# Patient Record
Sex: Female | Born: 1940 | Race: White | Hispanic: No | Marital: Married | Smoking: Never smoker
Health system: Southern US, Community
[De-identification: ages and names within clinical notes are randomized; demographics above are authoritative.]

## PROBLEM LIST (undated history)

## (undated) DIAGNOSIS — N6092 Unspecified benign mammary dysplasia of left breast: Secondary | ICD-10-CM

## (undated) HISTORY — DX: Unspecified benign mammary dysplasia of left breast: N60.92

---

## 2002-09-10 ENCOUNTER — Ambulatory Visit (HOSPITAL_COMMUNITY): Admission: RE | Admit: 2002-09-10 | Discharge: 2002-09-10 | Payer: Self-pay | Admitting: Cardiology

## 2003-08-19 ENCOUNTER — Ambulatory Visit (HOSPITAL_COMMUNITY): Admission: RE | Admit: 2003-08-19 | Discharge: 2003-08-19 | Payer: Self-pay | Admitting: General Surgery

## 2003-08-23 ENCOUNTER — Encounter: Payer: Self-pay | Admitting: General Surgery

## 2003-08-23 ENCOUNTER — Ambulatory Visit (HOSPITAL_COMMUNITY): Admission: RE | Admit: 2003-08-23 | Discharge: 2003-08-23 | Payer: Self-pay | Admitting: General Surgery

## 2003-09-09 ENCOUNTER — Encounter (INDEPENDENT_AMBULATORY_CARE_PROVIDER_SITE_OTHER): Payer: Self-pay | Admitting: Specialist

## 2003-09-09 ENCOUNTER — Inpatient Hospital Stay (HOSPITAL_COMMUNITY): Admission: RE | Admit: 2003-09-09 | Discharge: 2003-09-14 | Payer: Self-pay | Admitting: General Surgery

## 2009-03-14 ENCOUNTER — Ambulatory Visit: Payer: Self-pay | Admitting: Hematology & Oncology

## 2009-04-28 ENCOUNTER — Ambulatory Visit: Payer: Self-pay | Admitting: Hematology & Oncology

## 2009-05-01 LAB — COMPREHENSIVE METABOLIC PANEL
ALT: 10 U/L (ref 0–35)
AST: 14 U/L (ref 0–37)
Albumin: 3.7 g/dL (ref 3.5–5.2)
Alkaline Phosphatase: 63 U/L (ref 39–117)
Glucose, Bld: 109 mg/dL — ABNORMAL HIGH (ref 70–99)
Potassium: 4.1 mEq/L (ref 3.5–5.3)
Sodium: 142 mEq/L (ref 135–145)
Total Protein: 6.5 g/dL (ref 6.0–8.3)

## 2009-05-01 LAB — CBC WITH DIFFERENTIAL (CANCER CENTER ONLY)
BASO%: 0.2 % (ref 0.0–2.0)
EOS%: 2 % (ref 0.0–7.0)
LYMPH#: 1.8 10*3/uL (ref 0.9–3.3)
MCHC: 33.9 g/dL (ref 32.0–36.0)
MONO#: 0.4 10*3/uL (ref 0.1–0.9)
Platelets: 214 10*3/uL (ref 145–400)
RDW: 11.6 % (ref 10.5–14.6)
WBC: 5.1 10*3/uL (ref 3.9–10.0)

## 2009-05-15 ENCOUNTER — Inpatient Hospital Stay (HOSPITAL_COMMUNITY): Admission: RE | Admit: 2009-05-15 | Discharge: 2009-05-19 | Payer: Self-pay | Admitting: Orthopedic Surgery

## 2009-08-01 ENCOUNTER — Ambulatory Visit: Payer: Self-pay | Admitting: Hematology & Oncology

## 2009-08-03 LAB — CBC WITH DIFFERENTIAL (CANCER CENTER ONLY)
BASO%: 0.4 % (ref 0.0–2.0)
EOS%: 1.5 % (ref 0.0–7.0)
HGB: 11.6 g/dL (ref 11.6–15.9)
LYMPH#: 1.8 10*3/uL (ref 0.9–3.3)
LYMPH%: 37.7 % (ref 14.0–48.0)
MCH: 29.7 pg (ref 26.0–34.0)
MCHC: 33.6 g/dL (ref 32.0–36.0)
MCV: 89 fL (ref 81–101)
MONO%: 8.3 % (ref 0.0–13.0)
Platelets: 213 10*3/uL (ref 145–400)
RDW: 12.2 % (ref 10.5–14.6)
WBC: 4.7 10*3/uL (ref 3.9–10.0)

## 2009-08-04 LAB — COMPREHENSIVE METABOLIC PANEL
Albumin: 3.9 g/dL (ref 3.5–5.2)
Alkaline Phosphatase: 65 U/L (ref 39–117)
BUN: 15 mg/dL (ref 6–23)
Glucose, Bld: 103 mg/dL — ABNORMAL HIGH (ref 70–99)
Potassium: 4 mEq/L (ref 3.5–5.3)
Total Bilirubin: 0.5 mg/dL (ref 0.3–1.2)

## 2009-11-28 ENCOUNTER — Ambulatory Visit: Payer: Self-pay | Admitting: Hematology & Oncology

## 2009-11-30 LAB — CBC WITH DIFFERENTIAL (CANCER CENTER ONLY)
BASO%: 0.3 % (ref 0.0–2.0)
EOS%: 2.9 % (ref 0.0–7.0)
HCT: 37.2 % (ref 34.8–46.6)
HGB: 12.4 g/dL (ref 11.6–15.9)
LYMPH#: 1.8 10*3/uL (ref 0.9–3.3)
MCV: 90 fL (ref 81–101)
MONO#: 0.4 10*3/uL (ref 0.1–0.9)
MONO%: 7.7 % (ref 0.0–13.0)
NEUT#: 2.5 10*3/uL (ref 1.5–6.5)
NEUT%: 51.1 % (ref 39.6–80.0)
Platelets: 240 10*3/uL (ref 145–400)
RBC: 4.12 10*6/uL (ref 3.70–5.32)
RDW: 12.8 % (ref 10.5–14.6)

## 2009-11-30 LAB — CHCC SATELLITE - SMEAR

## 2009-11-30 LAB — FERRITIN: Ferritin: 189 ng/mL (ref 10–291)

## 2010-02-08 ENCOUNTER — Ambulatory Visit (HOSPITAL_COMMUNITY): Admission: RE | Admit: 2010-02-08 | Discharge: 2010-02-08 | Payer: Self-pay | Admitting: Orthopedic Surgery

## 2010-02-10 ENCOUNTER — Emergency Department (HOSPITAL_COMMUNITY): Admission: EM | Admit: 2010-02-10 | Discharge: 2010-02-10 | Payer: Self-pay | Admitting: Emergency Medicine

## 2010-05-28 ENCOUNTER — Ambulatory Visit: Payer: Self-pay | Admitting: Hematology & Oncology

## 2010-06-12 ENCOUNTER — Encounter: Admission: RE | Admit: 2010-06-12 | Discharge: 2010-06-12 | Payer: Self-pay | Admitting: Hematology & Oncology

## 2010-07-17 ENCOUNTER — Ambulatory Visit: Payer: Self-pay | Admitting: Hematology & Oncology

## 2010-09-19 ENCOUNTER — Inpatient Hospital Stay (HOSPITAL_COMMUNITY): Admission: RE | Admit: 2010-09-19 | Discharge: 2010-09-23 | Payer: Self-pay | Admitting: Orthopedic Surgery

## 2010-10-17 ENCOUNTER — Ambulatory Visit: Payer: Self-pay | Admitting: Hematology & Oncology

## 2011-01-29 LAB — CBC
HCT: 31.9 % — ABNORMAL LOW (ref 36.0–46.0)
HCT: 33.5 % — ABNORMAL LOW (ref 36.0–46.0)
HCT: 34.3 % — ABNORMAL LOW (ref 36.0–46.0)
Hemoglobin: 11.4 g/dL — ABNORMAL LOW (ref 12.0–15.0)
MCH: 31 pg (ref 26.0–34.0)
MCH: 31 pg (ref 26.0–34.0)
MCHC: 33.7 g/dL (ref 30.0–36.0)
MCV: 91.7 fL (ref 78.0–100.0)
Platelets: 190 10*3/uL (ref 150–400)
RBC: 3.48 MIL/uL — ABNORMAL LOW (ref 3.87–5.11)
RBC: 3.73 MIL/uL — ABNORMAL LOW (ref 3.87–5.11)
RDW: 13.5 % (ref 11.5–15.5)
RDW: 13.7 % (ref 11.5–15.5)
WBC: 7.4 10*3/uL (ref 4.0–10.5)

## 2011-01-29 LAB — TYPE AND SCREEN
ABO/RH(D): B POS
Antibody Screen: NEGATIVE

## 2011-01-29 LAB — PROTIME-INR
INR: 1.17 (ref 0.00–1.49)
INR: 1.2 (ref 0.00–1.49)
INR: 1.21 (ref 0.00–1.49)
INR: 1.27 (ref 0.00–1.49)
Prothrombin Time: 15.1 seconds (ref 11.6–15.2)
Prothrombin Time: 15.5 seconds — ABNORMAL HIGH (ref 11.6–15.2)
Prothrombin Time: 16.1 seconds — ABNORMAL HIGH (ref 11.6–15.2)

## 2011-01-29 LAB — BASIC METABOLIC PANEL
CO2: 29 mEq/L (ref 19–32)
Calcium: 9.4 mg/dL (ref 8.4–10.5)
Chloride: 98 mEq/L (ref 96–112)
Creatinine, Ser: 0.78 mg/dL (ref 0.4–1.2)
GFR calc non Af Amer: >60 mL/min (ref >60)
Glucose, Bld: 163 mg/dL — ABNORMAL HIGH (ref 70–99)
Potassium: 3.7 mEq/L (ref 3.5–5.1)

## 2011-01-29 LAB — CARDIAC PANEL(CRET KIN+CKTOT+MB+TROPI)
CK, MB: 0.4 ng/mL (ref 0.3–4.0)
CK, MB: 0.5 ng/mL (ref 0.3–4.0)
CK, MB: 0.5 ng/mL (ref 0.3–4.0)
Relative Index: INVALID (ref 0.0–2.5)
Relative Index: INVALID (ref 0.0–2.5)
Total CK: 34 U/L (ref 7–177)

## 2011-01-30 LAB — URINALYSIS, ROUTINE W REFLEX MICROSCOPIC
Bilirubin Urine: NEGATIVE
Nitrite: NEGATIVE
Protein, ur: NEGATIVE mg/dL
Specific Gravity, Urine: 1.02 (ref 1.005–1.030)
Urobilinogen, UA: 1 mg/dL (ref 0.0–1.0)

## 2011-01-30 LAB — COMPREHENSIVE METABOLIC PANEL
Albumin: 3.7 g/dL (ref 3.5–5.2)
Alkaline Phosphatase: 74 U/L (ref 39–117)
BUN: 14 mg/dL (ref 6–23)
Creatinine, Ser: 0.85 mg/dL (ref 0.4–1.2)
Glucose, Bld: 119 mg/dL — ABNORMAL HIGH (ref 70–99)
Total Protein: 7.2 g/dL (ref 6.0–8.3)

## 2011-01-30 LAB — PROTIME-INR
INR: 1.98 — ABNORMAL HIGH (ref 0.00–1.49)
Prothrombin Time: 22.7 seconds — ABNORMAL HIGH (ref 11.6–15.2)

## 2011-01-30 LAB — CBC
Hemoglobin: 12.9 g/dL (ref 12.0–15.0)
Platelets: 213 10*3/uL (ref 150–400)
RBC: 4.15 MIL/uL (ref 3.87–5.11)
RDW: 13.9 % (ref 11.5–15.5)

## 2011-01-30 LAB — APTT: aPTT: 35 seconds (ref 24–37)

## 2011-01-30 LAB — SURGICAL PCR SCREEN
MRSA, PCR: NEGATIVE
Staphylococcus aureus: NEGATIVE

## 2011-01-30 LAB — URINE MICROSCOPIC-ADD ON

## 2011-02-10 LAB — APTT
aPTT: 21 seconds — ABNORMAL LOW (ref 24–37)
aPTT: 28 seconds (ref 24–37)

## 2011-02-10 LAB — PROTIME-INR
INR: 1.19 (ref 0.00–1.49)
Prothrombin Time: 15 seconds (ref 11.6–15.2)
Prothrombin Time: 19.5 seconds — ABNORMAL HIGH (ref 11.6–15.2)

## 2011-02-10 LAB — URINALYSIS, ROUTINE W REFLEX MICROSCOPIC
Bilirubin Urine: NEGATIVE
Nitrite: NEGATIVE
Specific Gravity, Urine: 1.018 (ref 1.005–1.030)
Urobilinogen, UA: 1 mg/dL (ref 0.0–1.0)
pH: 5.5 (ref 5.0–8.0)

## 2011-02-10 LAB — COMPREHENSIVE METABOLIC PANEL
AST: 19 U/L (ref 0–37)
Alkaline Phosphatase: 63 U/L (ref 39–117)
CO2: 30 mEq/L (ref 19–32)
Calcium: 9.3 mg/dL (ref 8.4–10.5)
Creatinine, Ser: 0.75 mg/dL (ref 0.4–1.2)
GFR calc Af Amer: >60 mL/min (ref >60)
GFR calc non Af Amer: >60 mL/min (ref >60)
Glucose, Bld: 109 mg/dL — ABNORMAL HIGH (ref 70–99)
Potassium: 4.2 mEq/L (ref 3.5–5.1)
Total Protein: 6.9 g/dL (ref 6.0–8.3)

## 2011-02-10 LAB — CBC
Hemoglobin: 14 g/dL (ref 12.0–15.0)
MCHC: 34 g/dL (ref 30.0–36.0)
MCV: 91.5 fL (ref 78.0–100.0)
MCV: 92.6 fL (ref 78.0–100.0)
Platelets: 212 10*3/uL (ref 150–400)
RBC: 3.9 MIL/uL (ref 3.87–5.11)
RBC: 4.49 MIL/uL (ref 3.87–5.11)
RDW: 12.9 % (ref 11.5–15.5)
WBC: 6.8 10*3/uL (ref 4.0–10.5)

## 2011-02-10 LAB — DIFFERENTIAL
Lymphocytes Relative: 29 % (ref 12–46)
Lymphs Abs: 1.9 10*3/uL (ref 0.7–4.0)
Monocytes Relative: 8 % (ref 3–12)
Neutrophils Relative %: 61 % (ref 43–77)

## 2011-02-10 LAB — POCT I-STAT, CHEM 8
Chloride: 107 mEq/L (ref 96–112)
Glucose, Bld: 110 mg/dL — ABNORMAL HIGH (ref 70–99)
HCT: 45 % (ref 36.0–46.0)
Hemoglobin: 15.3 g/dL — ABNORMAL HIGH (ref 12.0–15.0)
Potassium: 3.7 mEq/L (ref 3.5–5.1)
Sodium: 141 mEq/L (ref 135–145)
TCO2: 25 mmol/L (ref 0–100)

## 2011-02-10 LAB — URINE MICROSCOPIC-ADD ON

## 2011-02-10 LAB — SAMPLE TO BLOOD BANK

## 2011-02-24 LAB — CBC
Platelets: 169 10*3/uL (ref 150–400)
WBC: 8 10*3/uL (ref 4.0–10.5)

## 2011-02-24 LAB — PROTIME-INR
INR: 1.7 — ABNORMAL HIGH (ref 0.00–1.49)
Prothrombin Time: 20.3 seconds — ABNORMAL HIGH (ref 11.6–15.2)
Prothrombin Time: 20.9 seconds — ABNORMAL HIGH (ref 11.6–15.2)

## 2011-02-25 LAB — ABO/RH: ABO/RH(D): B POS

## 2011-02-25 LAB — COMPREHENSIVE METABOLIC PANEL
ALT: 13 U/L (ref 0–35)
Albumin: 3.4 g/dL — ABNORMAL LOW (ref 3.5–5.2)
Alkaline Phosphatase: 72 U/L (ref 39–117)
GFR calc Af Amer: >60 mL/min (ref >60)
Potassium: 4.2 mEq/L (ref 3.5–5.1)
Sodium: 140 mEq/L (ref 135–145)
Total Protein: 6.5 g/dL (ref 6.0–8.3)

## 2011-02-25 LAB — BASIC METABOLIC PANEL
BUN: 10 mg/dL (ref 6–23)
CO2: 27 mEq/L (ref 19–32)
CO2: 31 mEq/L (ref 19–32)
Chloride: 98 mEq/L (ref 96–112)
Glucose, Bld: 108 mg/dL — ABNORMAL HIGH (ref 70–99)
Potassium: 3.5 mEq/L (ref 3.5–5.1)
Potassium: 3.6 mEq/L (ref 3.5–5.1)
Sodium: 134 mEq/L — ABNORMAL LOW (ref 135–145)

## 2011-02-25 LAB — URINALYSIS, ROUTINE W REFLEX MICROSCOPIC
Specific Gravity, Urine: 1.019 (ref 1.005–1.030)
Urobilinogen, UA: 0.2 mg/dL (ref 0.0–1.0)

## 2011-02-25 LAB — CBC
HCT: 33.6 % — ABNORMAL LOW (ref 36.0–46.0)
HCT: 34.2 % — ABNORMAL LOW (ref 36.0–46.0)
Hemoglobin: 11.7 g/dL — ABNORMAL LOW (ref 12.0–15.0)
MCHC: 34.3 g/dL (ref 30.0–36.0)
MCHC: 34.3 g/dL (ref 30.0–36.0)
MCV: 91.5 fL (ref 78.0–100.0)
Platelets: 156 10*3/uL (ref 150–400)
Platelets: 186 10*3/uL (ref 150–400)
RBC: 3.67 MIL/uL — ABNORMAL LOW (ref 3.87–5.11)
RDW: 13.4 % (ref 11.5–15.5)
RDW: 13.6 % (ref 11.5–15.5)
WBC: 6.7 10*3/uL (ref 4.0–10.5)

## 2011-02-25 LAB — URINE MICROSCOPIC-ADD ON

## 2011-02-25 LAB — PROTIME-INR
INR: 1.1 (ref 0.00–1.49)
INR: 2 — ABNORMAL HIGH (ref 0.00–1.49)

## 2011-04-02 NOTE — H&P (Signed)
NAME:  Kathryn Steele, Kathryn Steele           ACCOUNT NO.:  [id number]   MEDICAL RECORD NO.:  [id number]          PATIENT TYPE:  INP   LOCATION:  0009                         FACILITY:  [facility]   PHYSICIAN:  [name], M.D.    DATE OF BIRTH:  10-Aug-1941   DATE OF ADMISSION:  05/15/2009  DATE OF DISCHARGE:                              HISTORY & PHYSICAL   DATE OF OFFICE VISIT HISTORY AND PHYSICAL:  May 11, 2009.   CHIEF COMPLAINT:  Right knee pain.   HISTORY OF PRESENT ILLNESS:  The patient is a 70 year old female who has  been seen by Dr. Lequita Halt for ongoing knee pain.  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  The right is more symptomatic  and problematic than the left.  It has been going on for quite some time  now.  It has been refractory to conservative management.  Felt to  benefit from undergoing surgery.  Risks and benefits have been  discussed.  She elected to proceed with surgery.  She has been seen by  Dr. Dulce Sellar preoperatively.  The patient is felt to have an intermittent  risk.  The procedure does have a high-risk clinical variable, but the  patient has elected to proceed with surgery.  It is felt that at the  present time her heart failure is compensated.  It is not felt she is in  a high-risk room so can simply withdrawal the Coumadin which has been  stopped 5 days prior to surgery and then reinstitute Coumadin or  alternate with low molecular weight heparin.  She was also asked to stop  her aspirin 7 days.  She takes a beta blocker which she will take the  day of surgery.   ALLERGIES:  1. MACRODANTIN.  2. SEPTRA.  3. XANAX.  4. PSEUDOEPHEDRINE.  5. INTOLERANCE TO OXYCODONE WHICH CAUSES GI UPSET.   CURRENT MEDICATIONS:  1. Nitroglycerin.  2. Vasotec.  3. Amlodipine.  4. Furosemide.  5. Lopressor.  6. Liquid potassium chloride.  7. Coumadin.  8. Lipitor.  9. Baby aspirin.  10.AcipHex.  11.Clarinex.  12.Hydrocodone 7.5.  13.Albuterol  inhalation aerosol.  14.Evista.  15.Vitamin D.   PAST MEDICAL HISTORY:  1. History of atrial fibrillation.  2. Chronic rhinitis.  3. History of congestive heart failure.  4. Coronary arterial disease, 30% PDA irregularity.  5. Esophageal reflux.  6. Hiatal hernia.  7. Hypertension.  8. Impaired fasting glucose.  9. Left atrial enlargement.  10.Postmenopausal.  11.Mitral annular calcification with regurgitation.  12.Morbid obesity.  13.Osteoarthritis.  14.History of skin cancer.  15.Tricuspid regurgitation.  16.Varicose veins.  17.History of venous stasis.  18.History of shingles.  19.Hyperlipidemia.  20.Childhood illnesses of measles.   PAST SURGICAL HISTORY:  1. Gallbladder surgery in 1995.  2. Mohs surgery left eyelid in 1996.  3. Colon and hernia surgery in 2004.  4. Cardiac catheterization procedure.   FAMILY HISTORY:  Mother deceased at age 79 with heart disease.   SOCIAL HISTORY:  Married, nonsmoker.  No alcohol.  Husband will be  assisting with care after surgery.   REVIEW OF SYSTEMS:  GENERAL:  No fevers, chills or night sweats.  NEUROLOGIC:  No seizures, syncope or paralysis.  RESPIRATORY:  No  shortness of breath, productive cough or hemoptysis.  CARDIOVASCULAR:  No chest pain, angina, or orthopnea.  No palpitations.  GI:  No nausea,  vomiting, diarrhea, constipation.  GU:  No dysuria, hematuria or  discharge.  MUSCULOSKELETAL:  Right knee.   PHYSICAL EXAMINATION:  VITAL SIGNS:  Pulse 56, respirations 14, blood  pressure 132/74.  GENERAL:  A 70 year old female, well-nourished, well-developed,  overweight, obese, in no acute distress.  She is alert, oriented and  cooperative.  HEENT:  Normocephalic, atraumatic.  Pupils are round and reactive.  Oropharynx clear.  She does have a faint carotid bruit on the right.  NECK:  Supple.  CHEST:  Clear.  HEART:  Regular rate and rhythm although a bradycardic rhythm about 56.  S1-S2 noted.  ABDOMEN:  Soft,  protuberant.  Active bowel sounds present.  RECTAL/BREAST/GENITALIA:  Not done; not pertinent to present illness.  EXTREMITIES:  Right knee - no effusion, slight varus malalignment  deformity.  Range of motion 5 to 100 and marked crepitus is noted.   IMPRESSION:  Osteoarthritis right knee.   PLAN:  The patient admitted to [facility] to undergo right  total knee replacement arthroplasty.  Surgery will be performed by [name].      Kathryn Steele, P.A.C.      [name], M.D.  Electronically Signed    ALP/MEDQ  D:  05/14/2009  T:  05/15/2009  Job:  [id number]   cc:   Kathryn Rossetti, MD  Fax: [phone]   Kathryn Steele(?), M.D.  [facility]

## 2011-04-02 NOTE — Op Note (Signed)
NAME:  Kathryn, Steele           ACCOUNT NO.:  [id number]   MEDICAL RECORD NO.:  [id number]          PATIENT TYPE:  INP   LOCATION:  0009                         FACILITY:  [facility]   PHYSICIAN:  [name], M.D.    DATE OF BIRTH:  13-Jan-1941   DATE OF PROCEDURE:  05/15/2009  DATE OF DISCHARGE:                               OPERATIVE REPORT   PREOPERATIVE DIAGNOSIS:  Osteoarthritis, right knee.   POSTOPERATIVE DIAGNOSIS:  Osteoarthritis, right knee.   PROCEDURE:  Right total knee arthroplasty.   SURGEON:  [name], M.D.   ASSISTANT:  [name], P.A.C.   ANESTHESIA:  Spinal.   ESTIMATED BLOOD LOSS:  Minimal.   DRAINS:  None.   TOURNIQUET TIME:  40 minutes at 300 mmHg.   COMPLICATIONS:  None.   CONDITION:  Stable to recovery.   BRIEF CLINICAL NOTE:  Kathryn Steele is a 70 year old female with end-stage  arthritis of the right knee with progressively worsening pain and  dysfunction.  She has failed nonoperative management and presents for  right total knee arthroplasty.   PROCEDURE IN DETAIL:  After successful administration of spinal  anesthetic a tourniquet was placed high on her right thigh and right  lower extremity was prepped and draped in usual sterile fashion.  The  extremity was wrapped in Esmarch, knee flexed, tourniquet inflated to  300 mmHg.  A midline incision was made with a 10 blade through the  subcutaneous tissue to the level of the extensor mechanism.  A fresh  blade was used to make a medial parapatellar arthrotomy.  The soft  tissue on the proximal and medial tibia was subperiosteally elevated to  the joint line with the knife and into the semimembranosus bursa with a  Cobb elevator.  The soft tissue laterally was elevated with attention  being paid to avoid the patellar tendon on tibial tubercle.  The patella  was subluxed laterally, knee flexed 90 degrees, and the ACL and PCL were  removed.  The drill was used create a starting hole  in the distal femur  and the canal was thoroughly irrigated.  The 5 degrees right valgus  alignment guide was placed and referencing off the posterior condyles  rotation was marked and the block pinned to remove 10 mm off the distal  femur.  The distal femoral resection was made with an oscillating saw.  The sizing block was placed and size 3 was the most appropriate.  Rotation was marked at the epicondylar axis.  The size 3 cutting block  was placed and the anterior, posterior and chamfer cuts were made.   The tibia was subluxed forward and menisci removed.  The extramedullary  tibial alignment guide was placed referencing proximally at the medial  aspect of the tibial tubercle and distally along the second metatarsal  axis and tibial crest.  The block was pinned to remove 2 mm off the more  deficient medial side.  Tibial resection was made with an oscillating  saw.  The size 2.5 was the most appropriate tibial component and the  proximal tibia was prepared with the modular drill and keel punch  for  the size 2.5.  Femoral preparation was completed with the intercondylar  cut.   The size 2.5 mobile bearing tibial trial, the size 3 posterior  stabilized femoral trial and the 10 mm posterior stabilized rotating  platform insert trial were placed.  With the 10, full extension ws  achieved with excellent varus, valgus and anterior and posterior balance  throughout full range of motion.  The patella was then everted and  thickness measured to be 23 mm.  The freehand resection was taken to 13  mm, a 35 template was placed, lug holes were drilled, the trial patella  was placed and it tracks normally.  Osteophytes were removed off the  posterior femur with the trial in place.  All trials were removed and  the cut bone surfaces were prepared with pulsatile lavage.  The cement  was mixed and once ready for implantation the size 2.5 mobile bearing  tibial tray, the size 3 posterior stabilized  femur and 35 patella were  cemented into place.  The patella was held with a clamp.  The trial 10  mm insert was placed.  The knee held in full extension and all extruded  cement removed.  When the cement was fully hardened then the permanent  10 mm posterior stabilized rotating platform insert was placed into the  tibial tray.  The wound was copiously irrigated with saline solution and  FloSeal injected on the posterior capsule, the medial and lateral  gutters and suprapatellar area.  A moist sponge was placed and  tourniquet released for a total time of 40 minutes.  The sponge was held  for 2 minutes then removed.  Minimal bleeding was encountered.  The  bleeding that was encountered was stopped with electrocautery.  The  wound was again irrigated and the arthrotomy closed with interrupted #1  PDS.  Flexion against gravity was 130 degrees at which point calf and  posterior thigh are touching.  The subcutaneous was then closed with  interrupted 2-0 Vicryl and subcuticular running 4-0 Monocryl.  The  incision was then cleaned and dried and Steri-Strips and a bulky sterile  dressing were applied.  She was then placed into a knee immobilizer,  awakened and transported to recovery in stable condition.      [name], M.D.  Electronically Signed     FA/MEDQ  D:  05/15/2009  T:  05/15/2009  Job:  [id number]

## 2011-04-05 NOTE — Discharge Summary (Signed)
NAME:  Kathryn Steele, Kathryn Steele                     ACCOUNT NO.:  [id number]   MEDICAL RECORD NO.:  [id number]                   PATIENT TYPE:  INP   LOCATION:  0363                                 FACILITY:  [facility]   PHYSICIAN:  Angelia Mould. Derrell Lolling, M.D.             DATE OF BIRTH:  Apr 13, 1941   DATE OF ADMISSION:  09/09/2003  DATE OF DISCHARGE:  09/14/2003                                 DISCHARGE SUMMARY   FINAL DIAGNOSES:  1. 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  2. Paroxysmal atrial fibrillation history.  3. History of congestive heart failure on Coumadin.  4. Umbilical hernia.  5. Hypertension.  6. Obesity.   OPERATION PERFORMED:  1. Laparoscopic assisted right colectomy.  2. Repair of incarcerated ventral incisional hernia.   DATE OF SURGERY:  September 09, 2003.   HISTORY:  This is a 70 year old white female who was being worked up for  reflux and heartburn.  Upper endoscopy showed gastritis.  A colonoscopy was  performed on August 05, 2003 which showed a 3 cm diameter cecal polyp  which could not be removed because it was sessile.  This was biopsied and  showed villous adenoma with high-grade dysplasia.  Because of her cardiac  disease and morbid obesity she came to [facility] for consideration of  elective surgery.  She was scheduled for a laparoscopic colon resection.  She had preoperative cardiac clearance by Dr. Sylvie Farrier in [place].  She  underwent bowel prep at home.  She stopped her aspirin and Coumadin 5-6 days  preop.   PHYSICAL EXAMINATION:  GENERAL:  Pleasant middle-aged woman.  Weight of 256  pounds, height 5 feet 3 inches.  NECK:  No adenopathy.  RESPIRATORY:  Lungs were clear.  No rales or rhonchi.  CARDIOVASCULAR:  Regular rate and rhythm no murmur.  Peripheral pulses were  palpable.  ABDOMEN:  The abdomen was soft, obese, nontender.  Well-healed trocar sites  from cholecystectomy.  Four centimeter soft tissue mass at the umbilicus  which is only  partially reducible consistent with a partially incarcerated  incisional hernia at her previous trocar site.  LYMPHATIC:  No enlarged lymph node in the neck or groins.   HOSPITAL COURSE:  On the day of admission the patient was taken to the  operating room and underwent a laparoscopic assisted right colectomy with  primary anastomosis and we repaired her incarcerated ventral hernia at the  same time.   Final pathology report showed villous adenoma of the cecum with high-grade  dysplasia.  Ten lymph nodes were evaluated and were negative for tumor.   Postoperatively she was followed by [facility].  She was noted to be  in sinus rhythm, really did not have any problems with recurrent atrial  fibrillation.  She progressed in her activity and diet reasonably well.  She  was able to start tolerating liquids on about the third postop day and  advanced her diet fairly regularly thereafter  without much trouble.  Her  Coumadin was restarted when she resumed a diet and she recovered  uneventfully.   She was discharged on September 14, 2003.  At that time she was afebrile,  tolerating a diet, feeling well, was having bowel movements.  Her incisions  were healing nicely.   She was asked to follow up with me at the office in 5-7 days for staple  removal.  She was asked to follow up with Dr. Gilford Raid Harsh in [place] to  check her prothrombin time and regulate her Coumadin.   She requested a rolling walker and we gave her a prescription for that.   She was given a prescription for Vicodin for pain.  She was told to continue  her usual medications which include nitroglycerin 0.4 mg/hr patch daily,  Vasotec 10 mg b.i.d., Lasix 40 mg daily, Celebrex 200 mg daily, Lopressor 75  mg daily.  Potassium chloride 20 mEq daily.  Aspirin 81 mg daily.  Coumadin  5 mg alternating with 4.5 mg on a daily basis.  Lipitor 10 mg daily.  Provera 5 mg daily.  Aciphex 20 mg daily.                                                Angelia Mould. Derrell Lolling, M.D.    HMI/MEDQ  D:  09/25/2003  T:  09/25/2003  Job:  [id number]   cc:   Lorita Officer, M.D.  [address], [place]   [address]  [address] [place]  [place] [address]  Fax: [phone]   Heide Guile, MD  [address]  [place]  [place] [address]  Fax: [phone]   [facility]

## 2011-04-05 NOTE — Cardiovascular Report (Signed)
   NAME:  Kathryn Steele, Kathryn Steele                     ACCOUNT NO.:  [id number]   MEDICAL RECORD NO.:  [id number]                   PATIENT TYPE:  OIB   LOCATION:  2856                                 FACILITY:  [facility]   PHYSICIAN:  Charlton Haws, MD [facility]                DATE OF BIRTH:  Jan 22, 1941   DATE OF PROCEDURE:  09/10/2002  DATE OF DISCHARGE:                              CARDIAC CATHETERIZATION   PROCEDURES:  Coronary arteriography.   CARDIOLOGIST:  Charlton Haws, MD   INDICATIONS:  Chest pain and paroxysmal atrial fibrillation.   DESCRIPTION OF PROCEDURE:  Catheterization was done from the right femoral  artery.   RESULTS:  Left main coronary artery  was normal.   Left anterior descending artery was normal.   First and second diagonal branches were normal.   Circumflex coronary artery was nondominant and normal.   Right coronary artery was dominant.  Proximal, mid, and distal vessels were  normal.  There was a 30% mid PDA lesion.   LEFT VENTRICULOGRAPHY:  Left ventriculography showed hyperdynamic LV  function. EF was greater than 80%.  There was no gradient across the aortic  valve and no MR.  LV pressure was in the 115/15 range.  Aortic pressure was  in the 120/84 range.   IMPRESSION:  The patient's chest pain would appear to be noncardiac in  etiology.  She is 70 years  old with no obvious structural heart disease,  and I would not think initially she would need Coumadin for PAF.  However,  she will follow up with Dr. Sylvie Farrier for further medical management of her PAF,  hypertension, central obesity.                                               Charlton Haws, MD [facility]    PN/MEDQ  D:  09/10/2002  T:  09/11/2002  Job:  [id number]   cc:   Gilford Raid Harsh  [address]  [place]  [place] [address]  Fax: [phone]

## 2011-04-05 NOTE — Consult Note (Signed)
NAME:  Kathryn Steele, Kathryn Steele                     ACCOUNT NO.:  [id number]   MEDICAL RECORD NO.:  [id number]                   PATIENT TYPE:  INP   LOCATION:  0363                                 FACILITY:  [facility]   PHYSICIAN:  Rollene Rotunda, M.D.                DATE OF BIRTH:  Feb 21, 1941   DATE OF CONSULTATION:  09/09/2003  DATE OF DISCHARGE:                                   CONSULTATION   REQUESTING PHYSICIAN:  Angelia Mould. Derrell Lolling, M.D.   REASON FOR CONSULTATION:  Evaluate patient with heart failure and atrial  fibrillation status post right colectomy.   HISTORY OF PRESENT ILLNESS:  The patient is a lovely 70 year old white  female who had surgery today for resection of a probable villous adenoma.  She is status post right colectomy.  We are consulted to manage arrhythmias  and volume.   The patient's cardiac history include paroxysmal atrial fibrillation.  She  had this in October 2003 and apparently again one time this year.  She was  hospitalized at [facility] in October and had a cardiac catheterization  which demonstrated nonobstructive coronary disease and well-preserved  ejection fraction.  She has also had a Cardiolite which has demonstrated no  ischemia.  She has been maintained on Coumadin.  She has also been treated  with aspirin and topical nitrates per Dr. Sylvie Farrier.  Most recently she reports  she was hospitalized earlier this year in [place] with congestive heart  failure.  She does not recall being told what her ejection fraction was or  whether she had a weak or a strong heart.  I suspect a well-preserved  ejection fraction.   At baseline the patient reports that she does some household chores.  However, she is limited by osteoarthritis.  She gets dyspnea with exertion  such as walking a short distance on level ground.  She denies any PND or  orthopnea.  She has some occasional sporadic chest discomfort.  This seems  to be a stable pattern.  She cannot bring this on  with exertion.  She is not  describing exertional neck discomfort, arm discomfort, activity-induced  nausea, vomiting, excessive diaphoresis.  She occasionally will notice  palpitations but has no presyncope or syncope.   PAST MEDICAL HISTORY:  1. Paroxysmal atrial fibrillation.  2. Hyperlipidemia.  3. Hypertension.  4. Obesity.  5. Heart failure as described above.  6. Nonobstructive coronary disease (30% mid PDA stenosis).  7. Gastritis.  8. Cecal polyps.   PAST SURGICAL HISTORY:  1. D&C.  2. Hysterectomy.  3. Laparoscopic cholecystectomy.   ALLERGIES:  1. MACRODANTIN.  2. SEPTRA.  3. Prudy Feeler.   MEDICATIONS PRIOR TO ADMISSION:  1. Coumadin.  2. Nitro-Derm patch.  3. Vasotec 10 mg b.i.d.  4. Lasix 40 mg daily.  5. Celebrex 200 mg daily.  6. Lopressor 75 mg daily.  7. K-Dur 20 mEq daily.  8. Aspirin 81 mg daily.  9.  Centrum.  10.      Lipitor 10 mg q.h.s.  11.      Provera 5 mg daily.  12.      Aciphex 20 mg daily.   SOCIAL HISTORY:  The patient lives in [place] in with her husband.  She  does not drink alcohol or smoke cigarettes.   FAMILY HISTORY:  Is contributory for her mother dying with congestive heart  failure.  Otherwise, does not appear to be an early heart disease history.   REVIEW OF SYSTEMS:  As stated in the HPI and positive for wheezing and sinus  drainage.  Otherwise, negative for all other systems.   PHYSICAL EXAMINATION:  GENERAL:  The patient is in no distress.  VITAL SIGNS:  Blood pressure 154/90, heart rate 80 and regular, 98%  saturation on 2 liters.  HEENT:  Eyes unremarkable.  Pupils equal, round, and reactive to light.  Fundi not visualized.  Oral mucosa unremarkable.  NECK:  The patient lying flat, no bruits, no obvious thyromegaly.  LYMPHATIC:  No obvious cervical or inguinal adenopathy.  LUNGS:  Clear to auscultation anteriorly.  CHEST:  Unremarkable.  HEART:  PMI not displaced or sustained.  S1 and S2 within normal limits.  No  S3,  no S4, no murmurs.  ABDOMEN:  Surgical wound.  No bowel sounds.  Did not palpate secondary to  surgical pain. Obese.  EXTREMITIES:  Show 2+ pulses throughout.  No femoral bruits.  No edema, no  cyanosis, no clubbing.  NEUROLOGIC:  Sedated but appropriate, moves all extremities.   LABORATORY DATA:  Chest x-ray (October 5):  Mild cardiomegaly with mild  edema.   EKG (May 2004):  Sinus bradycardia, rate 47, nonspecific T wave changes.  No  current EKGs for comparison.   Laboratory:  Wbc 6.1, hemoglobin 12.9, platelets 279.  Sodium 140, potassium  3.6, BUN 22, creatinine 1.0, AST 20, ALT 13.  INR 1.6.   ASSESSMENT AND PLAN:  1. Atrial fibrillation.  The patient has a history of paroxysmal atrial     fibrillation.  Will keep her on telemetry.  We will start her Lopressor     back when she is able to take p.o.'s.  If she has any paroxysms of this     we will treat it most likely with IV Cardizem in the meantime.  She can     have her Coumadin restarted when it is safe from a surgical standpoint.     There would be no need for bridging heparin.  2. Congestive heart failure.  Will try to get an estimate of her ejection     fraction from [place] records.  I suspect she has a well-preserved EF.     Therefore, the attention would be turned toward volume management and     control of blood pressure.  Will collect strict I&O and use Lasix as     needed.  3. Coronary disease.  The patient has nonobstructive coronary disease.  She     can be restarted on her previous medications prior to discharge or when     able to take p.o.'s.                                               Rollene Rotunda, M.D.    JH/MEDQ  D:  09/09/2003  T:  09/09/2003  Job:  [id number]   cc:   Angelia Mould. Derrell Lolling, M.D.  [address], [address]  [place]  [place] [address]  Fax: [phone]   Heide Guile, MD  [address]  [place]  [place] [address]  Fax: [phone]

## 2011-04-05 NOTE — Discharge Summary (Signed)
NAME:  Kathryn Steele, Kathryn Steele           ACCOUNT NO.:  [id number]   MEDICAL RECORD NO.:  [id number]          PATIENT TYPE:  INP   LOCATION:  1602                         FACILITY:  [facility]   PHYSICIAN:  [name], M.D.    DATE OF BIRTH:  July 31, 1941   DATE OF ADMISSION:  05/15/2009  DATE OF DISCHARGE:  05/19/2009                               DISCHARGE SUMMARY   ADMISSION DIAGNOSES:  1. Osteoarthritis right knee.  2. History of atrial fibrillation.  3. Chronic rhinitis.  4. History of congestive heart failure.  5. Coronary arterial disease (30% posterior descending artery).  6. Esophageal reflux.  7. Hiatal hernia.  8. Hypertension.  9. Impaired fasting glucose.  10.Left atrial enlargement.  11.Postmenopausal.  12.Mitral annular calcification with regurgitation.  13.Morbid obesity.  14.Osteoarthritis.  15.History of skin cancer.  16.Tricuspid regurgitation.  17.Varicose veins.  18.History of venostasis.  19.History of shingles.  20.Hyperlipidemia.  21.Childhood illnesses of measles.   DISCHARGE DIAGNOSES:  1. 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  2. Postoperative hyponatremia, improved.  3. History of atrial fibrillation.  4. Chronic rhinitis.  5. History of congestive heart failure.  6. Coronary arterial disease (30% posterior descending artery).  7. Esophageal reflux.  8. Hiatal hernia.  9. Hypertension.  10.Impaired fasting glucose.  11.Left atrial enlargement.  12.Postmenopausal.  13.Mitral annular calcification with regurgitation.  14.Morbid obesity.  15.Osteoarthritis.  16.History of skin cancer.  17.Tricuspid regurgitation.  18.Varicose veins.  19.History of venostasis.  20.History of shingles.  21.Hyperlipidemia.  22.Childhood illnesses of measles.   PROCEDURE:  May 15, 2009, right total knee.   SURGEON:  [name], M.D.   ANESTHESIA:  Spinal.   TOURNIQUET TIME:  40.   CONSULTS:  None.   BRIEF  HISTORY:  Kathryn Steele is a 70 year old female with end-stage  arthritis of the right knee, progressive worsening pain and dysfunction,  failed operative management and now presents for right total knee  arthroplasty.   LABORATORY DATA:  Preoperative CBC showed hemoglobin 12.2, hematocrit  36.6, white cell count 4.8, platelets 186.  Postoperative hemoglobin  11.5, came back up to 11.7.  Last known H and H 10.6 and 31.7.  PT/INR  23.9 and 2.0 with PTT of 33.  Rechecked INR on day of admission 1.1.  Serial pro-times followed per Coumadin protocol.  Last known PT/INR 20.9  and 1.7.  Chem panel on admission low albumin of 3.4.  Remaining chem  panel within normal limits.  Serial BMETs were followed.  Sodium dropped  from 140 to 132, back up to 134.  Preoperative UA small hemoglobin,  small leukocyte esterase, many epithelials, 3-6 white cells, only 0-2  red cells, many bacteria.  Blood group type B+.   DIAGNOSTICS:  Chest x-ray May 10, 2009:  Bilateral central mild  peribronchial thickening, probably due to chronic mild bronchitic  changes, no acute infiltrate or edema.  There is nodular density in the  left base probable nodule or nipple.  Follow up non-emergent study with  nipple markers suggested   HOSPITAL COURSE:  The patient was admitted to [facility],  taken to the OR and underwent the above stated procedure without  complication.  The patient tolerated the procedure well. Later  transferred to the recovery room on the orthopedic floor.  Started on  PCA and p.o. analgesics.  Had a rough night on the evening of surgery.  Seen in rounds by Dr. Lequita Halt.  Did not get much rest.  She actually was  transferred to telemetry for monitoring.  Did okay and then later  transferred to the orthopedic floor on day 1.  She did have Duramorph.  Pressure was stable.  Hemoglobin looked good, although she did have a  little bit of hyponatremia.  She had a history of congestive heart   failure, but had excellent urinary output and pressure was okay, so we  to reduced the fluid rate, started back on her home medications.  Put  some of her blood pressure medications on parameters though so she would  not bottom out.  Actually did pretty well and by day 2, she was up  walking about 35 feet.  Dressing changed on day 2 and incision looked  good.  Doing a little better with her pain.  Hemoglobin was down a  little bit from her preoperative, but asymptomatic with the anemia.  Pressure remained stable.  Continued to get up with therapy walking 100  feet by day 3.  Hemoglobin was stable at that point.  Slowly increased  on her INR.  Needed another day with therapy.  By postoperative day 4,  she was doing better, pain under good control, tolerating her  medications and was discharged home.   DISPOSITION:  Discharged home on May 19, 2009.   DISCHARGE MEDICATIONS:  1. Lovenox before discharge.  2. Then Vicodin.  3. Robaxin.  4. Coumadin.   DIET:  Heart-healthy diet.   FOLLOW UP:  Follow up in 2 weeks.   ACTIVITY:  Total knee protocol.  Home PT home nursing.   DISPOSITION:  Home.   CONDITION ON DISCHARGE:  Improved.      Kathryn Steele, P.A.C.      [name], M.D.  Electronically Signed    ALP/MEDQ  D:  05/31/2009  T:  05/31/2009  Job:  [id number]   cc:   [name], M.D.  Fax: [phone]   Feliciana Rossetti, MD  Fax: [phone]   Judge Stall, MD

## 2011-04-05 NOTE — Op Note (Signed)
NAME:  Kathryn Steele, Kathryn Steele                     ACCOUNT NO.:  [id number]   MEDICAL RECORD NO.:  [id number]                   PATIENT TYPE:  INP   LOCATION:  X001                                 FACILITY:  [facility]   PHYSICIAN:  Angelia Mould. Derrell Lolling, M.D.             DATE OF BIRTH:  [birth year]   DATE OF PROCEDURE:  09/09/2003  DATE OF DISCHARGE:                                 OPERATIVE REPORT   PREOPERATIVE DIAGNOSES:  1. Villous adenoma of the cecum.  2. Ventral hernia.   POSTOPERATIVE DIAGNOSES:  1. Villous adenoma of the cecum.  2. Incarcerated ventral hernia.   </OPERATION PERFORMED>  1. Laparoscopic-assisted right colectomy.  2. Repair of ventral hernia.   SURGEON:  Angelia Mould. Derrell Lolling, M.D.   FIRST ASSISTANT:  Currie Paris, M.D.   OPERATIVE INDICATION:  This is a 70 year old white female with reflux  problems and a history of hypertension and congestive heart failure and  paroxysmal atrial fibrillation.  She has had a recent GI workup, which  showed gastritis on the upper endoscopy and a 3.0 cm polyp in the cecum, and  biopsy showed a villous adenoma with dysplasia.  A CT scan shows no evidence  of any metastatic disease.  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  She was counseled in the office regarding surgical options.  She  underwent a two-day bowel prep at home.  She is brought to the operating  room electively for a right colon resection and repair of her large hernia,  which is around her umbilicus.  This is within a previous laparoscopic scar  from cholecystectomy.   OPERATIVE FINDINGS:  The patient had a ventral hernia around her umbilicus,  which was at least 6 cm in diameter.  There were extensive adhesions of  omentum up into this, which required about 30 minutes to release prior to  proceeding with the other surgery.  She had mild adhesions of the omentum up  to the gallbladder fossa.  Small bowel, colon, liver, stomach, and duodenum  otherwise looked normal.  There was no fluid within the abdomen.  The  peritoneal surfaces looked normal.   OPERATIVE TECHNIQUE:  Following the induction of general endotracheal  anesthesia, a Foley catheter and orogastric tube were placed.  The abdomen  was then prepped and draped in a sterile fashion.  A 10 mm trocar was placed  in the left upper quadrant.  A small incision was made and an Optiview port  was inserted with the video camera within the port, and we were able to  insert this through the abdominal wall layers without much trouble.  Insufflation was then carried out and we got a good pneumoperitoneum.  The  video camera was inserted.  Everything looked fine.  There was no sign of  any bleeding or trauma.  We placed two 5 mm trocars in the left midabdomen  and then using a Harmonic  scalpel and counter traction, we carefully took  the adhesions down around the ventral hernia defect.  This took a great deal  of time as they were fairly extensive, but there was no evidence of any  involvement of the GI tract and there was good hemostasis after we were  done.   We then ultimately put a 5 mm trocar in the right midabdomen and a 10 mm  trocar in the subxiphoid region.  With the patient in a variety of  positions, we mobilized the right colon.  We divided the peritoneum lateral  to the terminal ileum and appendix and then divided the lateral peritoneal  attachments of the entire right colon, bluntly mobilizing the right colon  over to the midline.  This mobilization went fairly smoothly.  We then  repositioned the patient and took the hepatic flexure down using the  Harmonic scalpel, mobilized the hepatic flexure all the way over to the  level of the falciform ligament.  You could pull the hepatic flexure down  nicely and pull the cecum over to the left quite nicely.   We then removed our instruments and released the pneumoperitoneum.  We made  a transverse incision overlying the  umbilicus.  We debrided a very large  complex hernia sac until we could identify the rim of ventral hernia and  then enter the abdominal cavity.  We brought the terminal ileum and right  colon up through this defect.  We placed stay sutures in the terminal ileum  to avoid twisting.  We transected the terminal ileum about one inch proximal  to the ileocecal valve.  We took down the mesentery using electrocautery,  dividing the larger ileocolic vessels and the right colic vessels between  clamps and ligating with 2-0 silk suture ligatures and 2-0 silk ties.  We  transected the right transverse colon using a GIA stapling device.  The  specimen was removed and sent to the lab.  Dr. Kieth Brightly examined the  specimen and stated that there was a 2-3 cm soft polyp in the cecum  consistent with the colonoscopic findings.   Anastomosis was created between the terminal ileum and the right transverse  colon using the GIA stapling device.  The defect left in the bowel wall was  closed using a TA-60 stapling device.  The staple lines looked good.  They  were quite hemostatic.  At this point we changed our instruments and our  gloves.  The mesentery was closed with interrupted sutures of 2-0 silk.  We  irrigated the wound and then returned the bowel to the abdominal cavity.  Hemostasis looked good.  We irrigated out the abdominal wound.  We closed  the abdominal wall fascia transversely with numerous interrupted simple and  several interrupted figure-of-eight sutures of #1 Novofil.  This provided a  very secure closure.  Pneumoperitoneum was created.  We had an airtight  closure of the fascia.  We reinserted our video camera and our instruments  and inspected the remaining colon and small bowel and the anastomosis, and  everything looked good.  We irrigated somewhat, but there was no bleeding.  All the irrigation fluid was removed.  The pneumoperitoneum was released.  All the trocars were removed and  there  was no bleeding from the trocar sites.  A 19 Blake drain was placed in the  periumbilical incision, where the hernia sac and hernia space had been.  This was brought out through one of the trocar sites and  sutured to the skin  with a nylon suture and connected to a suction bulb.  All of the skin  incisions were closed with skin staples.  Clean bandages were placed and the  patient taken to the recovery room in stable condition.  Estimated blood  loss was about 100 mL.  Complications:  None.  Sponge, needle, and  instrument counts were correct.                                               Angelia Mould. Derrell Lolling, M.D.    HMI/MEDQ  D:  09/09/2003  T:  09/09/2003  Job:  [id number]   cc:   [facility]  [address] [place]  [place] [address]  Fax: [phone]   Nadine Counts  [address]  [place] [address]  Fax: [phone]   Heide Guile, MD  [address]  [place]  [place] [address]  Fax: [phone]   [facility]

## 2011-05-23 ENCOUNTER — Other Ambulatory Visit: Payer: Self-pay | Admitting: Hematology & Oncology

## 2011-05-23 ENCOUNTER — Encounter (HOSPITAL_BASED_OUTPATIENT_CLINIC_OR_DEPARTMENT_OTHER): Payer: Medicare Other | Admitting: Hematology & Oncology

## 2011-05-23 DIAGNOSIS — N6089 Other benign mammary dysplasias of unspecified breast: Secondary | ICD-10-CM

## 2011-05-23 DIAGNOSIS — D649 Anemia, unspecified: Secondary | ICD-10-CM

## 2011-05-23 DIAGNOSIS — E559 Vitamin D deficiency, unspecified: Secondary | ICD-10-CM

## 2011-05-23 LAB — CBC WITH DIFFERENTIAL (CANCER CENTER ONLY)
BASO#: 0 10*3/uL (ref 0.0–0.2)
Eosinophils Absolute: 0.2 10*3/uL (ref 0.0–0.5)
HCT: 36 % (ref 34.8–46.6)
HGB: 11.8 g/dL (ref 11.6–15.9)
LYMPH%: 34.9 % (ref 14.0–48.0)
MCH: 30.1 pg (ref 26.0–34.0)
MCV: 92 fL (ref 81–101)
MONO%: 12.2 % (ref 0.0–13.0)
RBC: 3.92 10*6/uL (ref 3.70–5.32)

## 2011-12-04 ENCOUNTER — Ambulatory Visit (HOSPITAL_BASED_OUTPATIENT_CLINIC_OR_DEPARTMENT_OTHER): Payer: Medicare Other | Admitting: Hematology & Oncology

## 2011-12-04 ENCOUNTER — Other Ambulatory Visit (HOSPITAL_BASED_OUTPATIENT_CLINIC_OR_DEPARTMENT_OTHER): Payer: Medicare Other | Admitting: Lab

## 2011-12-04 DIAGNOSIS — N6089 Other benign mammary dysplasias of unspecified breast: Secondary | ICD-10-CM

## 2011-12-04 DIAGNOSIS — E559 Vitamin D deficiency, unspecified: Secondary | ICD-10-CM

## 2011-12-04 DIAGNOSIS — D649 Anemia, unspecified: Secondary | ICD-10-CM

## 2011-12-04 DIAGNOSIS — N6099 Unspecified benign mammary dysplasia of unspecified breast: Secondary | ICD-10-CM

## 2011-12-04 DIAGNOSIS — I482 Chronic atrial fibrillation, unspecified: Secondary | ICD-10-CM

## 2011-12-04 DIAGNOSIS — R5383 Other fatigue: Secondary | ICD-10-CM

## 2011-12-04 DIAGNOSIS — R5381 Other malaise: Secondary | ICD-10-CM

## 2011-12-04 LAB — CBC WITH DIFFERENTIAL (CANCER CENTER ONLY)
BASO#: 0 10*3/uL (ref 0.0–0.2)
BASO%: 0.6 % (ref 0.0–2.0)
EOS%: 2.9 % (ref 0.0–7.0)
HCT: 35.1 % (ref 34.8–46.6)
HGB: 11.3 g/dL — ABNORMAL LOW (ref 11.6–15.9)
LYMPH#: 1.5 10*3/uL (ref 0.9–3.3)
MONO#: 0.6 10*3/uL (ref 0.1–0.9)
NEUT#: 2.6 10*3/uL (ref 1.5–6.5)
NEUT%: 54.4 % (ref 39.6–80.0)
RDW: 13.2 % (ref 11.1–15.7)
WBC: 4.8 10*3/uL (ref 3.9–10.0)

## 2011-12-04 NOTE — Progress Notes (Signed)
This office note has been dictated.

## 2011-12-04 NOTE — Progress Notes (Signed)
DIAGNOSIS:  Atypical ductal hyperplasia of the left breast.  CURRENT THERAPY:  Aromasin 25 mg p.o. daily.  INTERIM HISTORY:  [name] comes in for followup.  She is doing quite well.  She is getting through her knee surgery.  She is now off therapy for this.  Her husband is doing better.  He is being treated at [facility] for mantle cell lymphoma.  It seems like he has had a very nice response.  He is taking Rituxan right now.  [name] still has her issues with atrial fibrillation.  She gets quite fatigued.  She has dyspnea with mild exertion.  She is now on Xarelto.  She really enjoys being on this as she does not need to have her blood checked all the time, and she can eat what she wants.  She is having no problems with the Aromasin.  There have been no hot flashes or sweats.  PHYSICAL EXAM:  General:  This is a mildly obese white female in no obvious distress.  Vital Signs:  Temperature of 98, pulse 90, respiratory rate 18, blood pressure 115/69.  Weight is 230.  Head/Neck: Exam shows a normocephalic, atraumatic skull.  There are no ocular or oral lesions.  There are no palpable cervical or supraclavicular lymph nodes.  Lungs:  Clear bilaterally.  Cardiac:  Regular rate and irregular rhythm consistent with atrial fibrillation.  She has no murmurs, rubs or bruits.  Breasts:  Exam shows right breast with no masses, edema or erythema.  There is no right axillary adenopathy. The left breast shows a well-healed biopsy scar at the 1 o'clock position.  There may be some slight contraction of the left breast.  No tenderness is noted to palpation of the left breast.  There is no left axillary adenopathy. Abdomen:  Soft with good bowel sounds.  She is somewhat obese.  She has no fluid wave.  There is no palpable hepatosplenomegaly.  Extremities: Some stasis dermatitis changes in the lower extremities.  Skin:  No rashes, ecchymosis or petechiae.  LABORATORY STUDIES:  White cell  count 4.8, hemoglobin 11.3, hematocrit 35.1, platelet count 196.  IMPRESSION:  Kathryn Steele is a 71 year old white female with atypical ductal hyperplasia of the left breast.  She is doing well with Aromasin. Again, Aromasin is more of a preventive measure so that she does not develop anything more invasive.  I am glad that her husband is doing better.  He had a really tough time with treatment.  We will have Ms. Reising come back to see [place] in another 6 months.    ______________________________ Josph Macho, M.D. PRE/MEDQ  D:  12/04/2011  T:  12/04/2011  Job:  [id number]

## 2011-12-05 LAB — VITAMIN D 25 HYDROXY (VIT D DEFICIENCY, FRACTURES): Vit D, 25-Hydroxy: 43 ng/mL (ref 30–89)

## 2011-12-05 LAB — COMPREHENSIVE METABOLIC PANEL
ALT: 10 U/L (ref 0–35)
AST: 17 U/L (ref 0–37)
Albumin: 4 g/dL (ref 3.5–5.2)
CO2: 27 mEq/L (ref 19–32)
Calcium: 9.6 mg/dL (ref 8.4–10.5)
Chloride: 102 mEq/L (ref 96–112)
Creatinine, Ser: 0.91 mg/dL (ref 0.50–1.10)
Potassium: 3.9 mEq/L (ref 3.5–5.3)
Total Protein: 6.5 g/dL (ref 6.0–8.3)

## 2011-12-12 ENCOUNTER — Other Ambulatory Visit: Payer: Self-pay | Admitting: Hematology & Oncology

## 2011-12-30 ENCOUNTER — Telehealth: Payer: Self-pay | Admitting: *Deleted

## 2011-12-30 NOTE — Telephone Encounter (Signed)
Message copied by Mirian Capuchin on Mon Dec 30, 2011  9:18 AM ------      Message from: Arlan Organ R      Created: Thu Dec 26, 2011  6:53 PM       Labs and vit D are ok!!  Kathryn Steele

## 2011-12-30 NOTE — Telephone Encounter (Signed)
Called patient to let her know that her labs and vitamin d levels were ok per dr. Myna Hidalgo .

## 2012-06-01 ENCOUNTER — Ambulatory Visit (HOSPITAL_BASED_OUTPATIENT_CLINIC_OR_DEPARTMENT_OTHER): Payer: Medicare Other | Admitting: Hematology & Oncology

## 2012-06-01 ENCOUNTER — Other Ambulatory Visit: Payer: Medicare Other | Admitting: Lab

## 2012-06-01 VITALS — BP 123/71 | HR 72 | Temp 97.0°F | Ht 64.0 in | Wt 232.0 lb

## 2012-06-01 DIAGNOSIS — N6089 Other benign mammary dysplasias of unspecified breast: Secondary | ICD-10-CM

## 2012-06-01 DIAGNOSIS — D0590 Unspecified type of carcinoma in situ of unspecified breast: Secondary | ICD-10-CM

## 2012-06-01 DIAGNOSIS — M818 Other osteoporosis without current pathological fracture: Secondary | ICD-10-CM

## 2012-06-01 MED ORDER — VITAMIN D (ERGOCALCIFEROL) 1.25 MG (50000 UNIT) PO CAPS: 50000.0000 [IU] | ORAL_CAPSULE | ORAL | Status: DC

## 2012-06-01 MED ORDER — EXEMESTANE 25 MG PO TABS: 25.0000 mg | ORAL_TABLET | Freq: Every day | ORAL | Status: DC

## 2012-06-01 NOTE — Progress Notes (Signed)
This office note has been dictated.

## 2012-06-02 NOTE — Progress Notes (Signed)
CC:   Kathryn Pia, MD  DIAGNOSIS:  Atypical ductal hyperplasia, left breast.  CURRENT THERAPY:  Aromasin 25 mg p.o. daily.  INTERIM HISTORY:  Kathryn Steele comes in for followup.  We see her every 6 months.  She is still doing well with Xarelto.  She has atrial fibrillation that is fairly well-controlled.  There have been no problems with bleeding.  She does get tired with mild exertion.  Patient has had no fevers.  There have been no rashes.  There has been no increased leg swelling.  She does wear compression stockings on her legs.  She has had no cough.  There has been no dysphasia or odynophagia.  There has been no double vision or blurred vision.  PHYSICAL EXAMINATION:  This is an obese, well-nourished white female in no obvious distress.  Vital signs:  Temperature of 97, pulse 73, respiratory rate 18, blood pressure 123/71.  Weight is 232. Head and neck:  Normocephalic, atraumatic skull.  There are no ocular or oral lesions.  There are no palpable cervical or supraclavicular lymph nodes. Lungs:  Clear bilaterally.  Cardiac:  Regular rate and rhythm consistent with atrial fibrillation.  Her rate is well controlled.  Abdomen:  Soft with good bowel sounds.  There is no palpable abdominal mass.  There is no fluid wave.  There is no palpable hepatosplenomegaly.  Back:  No tenderness over the spine, ribs, or hips.  Extremities:  No clubbing, cyanosis or edema.  Neurologic:  No focal neurological deficit.  LABORATORY STUDIES:  Not done this visit.  IMPRESSION:  Kathryn Steele is a nice 71 year old white female with a history of atypical ductal hyperplasia.  She is on Aromasin as a preventive agent.  I believe that she is doing very well with Aromasin. I believe Aromasin is helping her out.  We will go ahead and plan to get her back in 6 more months.  Will check some lab work on her when we see her back.    ______________________________ Josph Macho, M.D. PRE/MEDQ  D:   06/01/2012  T:  06/02/2012  Job:  [id number]

## 2012-12-02 ENCOUNTER — Ambulatory Visit (HOSPITAL_BASED_OUTPATIENT_CLINIC_OR_DEPARTMENT_OTHER): Payer: Medicare Other | Admitting: Hematology & Oncology

## 2012-12-02 ENCOUNTER — Other Ambulatory Visit (HOSPITAL_BASED_OUTPATIENT_CLINIC_OR_DEPARTMENT_OTHER): Payer: Medicare Other | Admitting: Lab

## 2012-12-02 VITALS — BP 125/54 | HR 74 | Temp 98.1°F | Resp 16 | Ht 65.0 in | Wt 233.0 lb

## 2012-12-02 DIAGNOSIS — D0510 Intraductal carcinoma in situ of unspecified breast: Secondary | ICD-10-CM

## 2012-12-02 DIAGNOSIS — D649 Anemia, unspecified: Secondary | ICD-10-CM

## 2012-12-02 DIAGNOSIS — D0590 Unspecified type of carcinoma in situ of unspecified breast: Secondary | ICD-10-CM

## 2012-12-02 DIAGNOSIS — M810 Age-related osteoporosis without current pathological fracture: Secondary | ICD-10-CM

## 2012-12-02 LAB — CBC WITH DIFFERENTIAL (CANCER CENTER ONLY)
BASO#: 0 10*3/uL (ref 0.0–0.2)
EOS%: 3.7 % (ref 0.0–7.0)
Eosinophils Absolute: 0.2 10*3/uL (ref 0.0–0.5)
HCT: 36.4 % (ref 34.8–46.6)
HGB: 11.8 g/dL (ref 11.6–15.9)
LYMPH%: 31.8 % (ref 14.0–48.0)
MCH: 30.2 pg (ref 26.0–34.0)
MCHC: 32.4 g/dL (ref 32.0–36.0)
MCV: 93 fL (ref 81–101)
MONO%: 11.6 % (ref 0.0–13.0)
NEUT#: 2.4 10*3/uL (ref 1.5–6.5)
NEUT%: 52.3 % (ref 39.6–80.0)
RBC: 3.91 10*6/uL (ref 3.70–5.32)

## 2012-12-02 NOTE — Progress Notes (Signed)
This office note has been dictated.

## 2012-12-03 LAB — COMPREHENSIVE METABOLIC PANEL
Albumin: 4.2 g/dL (ref 3.5–5.2)
BUN: 19 mg/dL (ref 6–23)
Calcium: 9.5 mg/dL (ref 8.4–10.5)
Chloride: 103 mEq/L (ref 96–112)
Creatinine, Ser: 0.75 mg/dL (ref 0.50–1.10)
Glucose, Bld: 100 mg/dL — ABNORMAL HIGH (ref 70–99)
Potassium: 3.7 mEq/L (ref 3.5–5.3)

## 2012-12-03 LAB — IRON AND TIBC
Iron: 72 ug/dL (ref 42–145)
TIBC: 395 ug/dL (ref 250–470)

## 2012-12-03 LAB — VITAMIN D 25 HYDROXY (VIT D DEFICIENCY, FRACTURES): Vit D, 25-Hydroxy: 41 ng/mL (ref 30–89)

## 2012-12-03 NOTE — Progress Notes (Signed)
CC:   Kathryn Pia, MD  DIAGNOSES: 1. Atypical ductal hyperplasia of the left breast. 2. Chronic atrial fibrillation.  CURRENT THERAPY: 1. Aromasin 25 mg p.o. daily. 2. Xarelto 20 mg p.o. daily.  INTERIM HISTORY:  Kathryn Steele comes in for her followup.  We see her pretty much once a year now.  I saw her last in July.  She has been doing okay.  She is having some knee issues.  She did have bilateral knee surgery a couple of years ago.  She has had no problems with the Xarelto.  There has been no bleeding. She has had no cough.  There has been no headache.  She has some stasis dermatitis in her legs.  She wears compression stockings.  PHYSICAL EXAMINATION:  General:  This is a mildly obese white female in no obvious distress.  Vital signs:  Temperature of 98.1, pulse 74, respiratory rate 16, blood pressure 125/54.  Weight is 233.  Head and neck:  Normocephalic, atraumatic skull.  There are no ocular or oral lesions.  There are no palpable cervical or supraclavicular lymph nodes. Lungs:  Clear bilaterally.  There are no rales, wheezes, or rhonchi. Cardiac:  Regular rate and rhythm consistent with atrial fibrillation. Her rate is well controlled.  She has a 1/6 systolic ejection murmur. Breasts:  Right breast with no masses, edema, or erythema.  There is no right axillary adenopathy.  Left breast shows well-healed lumpectomy at the, I think, 4 o'clock position.  This is at the edge of the areola. No distinct mass is noted in the left breast.  There is no left axillary adenopathy.  Abdomen:  Soft with good bowel sounds.  There is no palpable abdominal mass.  There is no fluid wave.  There is no palpable hepatosplenomegaly.  Back:  No tenderness over the spine, ribs, or hips. There is no kyphosis or osteoporotic changes.  Extremities:  Compression stockings are on her lower legs.  Skin:  No rashes, ecchymosis, or petechia.  LABORATORY STUDIES:  White cell count is 4.7, hemoglobin  11.8, hematocrit 36.4, platelet count 217.  Ferritin is 69 with an iron saturation of 18%.  IMPRESSION:  Kathryn Steele is a 72 year old white female with atypical ductal hyperplasia of the left breast.  She had this removed.  I have her on Aromasin given that she is a high-risk patient.  I think we can probably get her back yearly now.  I think that it has now been probably 4 or 5 years that she had the surgery for the hyperplasia.  I do not think we need to get her back in between for any lab work or x- rays.    ______________________________ Josph Macho, M.D. PRE/MEDQ  D:  12/02/2012  T:  12/03/2012  Job:  [id number]

## 2012-12-04 ENCOUNTER — Telehealth: Payer: Self-pay | Admitting: Oncology

## 2012-12-04 NOTE — Telephone Encounter (Addendum)
Message copied by Lacie Draft on Fri Dec 04, 2012  1:45 PM ------      Message from: Arlan Organ R      Created: Thu Dec 03, 2012  6:53 PM       Call - labs are ok!!!  Cindee Lame  12/04/2012 Gave pt message. Teola Bradley, Marlyn Rabine [employer]

## 2013-08-13 ENCOUNTER — Other Ambulatory Visit: Payer: Self-pay | Admitting: *Deleted

## 2013-08-13 DIAGNOSIS — D0590 Unspecified type of carcinoma in situ of unspecified breast: Secondary | ICD-10-CM

## 2013-08-13 MED ORDER — VITAMIN D (ERGOCALCIFEROL) 1.25 MG (50000 UNIT) PO CAPS: 50000.0000 [IU] | ORAL_CAPSULE | ORAL | Status: DC

## 2013-08-13 NOTE — Telephone Encounter (Signed)
Received a refill request from [facility] for Vitamin D 50,000 iu q week. This is a chronic med for the pt. Refilled via e-rx.

## 2013-09-28 ENCOUNTER — Other Ambulatory Visit: Payer: Self-pay | Admitting: *Deleted

## 2013-09-28 DIAGNOSIS — N6099 Unspecified benign mammary dysplasia of unspecified breast: Secondary | ICD-10-CM

## 2013-09-28 MED ORDER — EXEMESTANE 25 MG PO TABS: 25.0000 mg | ORAL_TABLET | ORAL | Status: DC

## 2013-09-28 NOTE — Telephone Encounter (Signed)
Received a refill request from [facility] for Exemestane 25 mg daily. This is a chronic med for the pt. Refilled via e-rx.

## 2013-11-16 DIAGNOSIS — I4821 Permanent atrial fibrillation: Secondary | ICD-10-CM | POA: Insufficient documentation

## 2013-11-23 DIAGNOSIS — R7303 Prediabetes: Secondary | ICD-10-CM | POA: Insufficient documentation

## 2013-11-23 DIAGNOSIS — E119 Type 2 diabetes mellitus without complications: Secondary | ICD-10-CM | POA: Insufficient documentation

## 2013-12-01 ENCOUNTER — Other Ambulatory Visit (HOSPITAL_BASED_OUTPATIENT_CLINIC_OR_DEPARTMENT_OTHER): Payer: Medicare Other | Admitting: Lab

## 2013-12-01 ENCOUNTER — Encounter: Payer: Self-pay | Admitting: Hematology & Oncology

## 2013-12-01 ENCOUNTER — Ambulatory Visit (HOSPITAL_BASED_OUTPATIENT_CLINIC_OR_DEPARTMENT_OTHER): Payer: Medicare Other | Admitting: Hematology & Oncology

## 2013-12-01 VITALS — BP 131/52 | HR 57 | Temp 98.2°F | Resp 14 | Ht 63.0 in | Wt 240.0 lb

## 2013-12-01 DIAGNOSIS — D0590 Unspecified type of carcinoma in situ of unspecified breast: Secondary | ICD-10-CM

## 2013-12-01 DIAGNOSIS — D0510 Intraductal carcinoma in situ of unspecified breast: Secondary | ICD-10-CM

## 2013-12-01 DIAGNOSIS — N6092 Unspecified benign mammary dysplasia of left breast: Secondary | ICD-10-CM

## 2013-12-01 DIAGNOSIS — D649 Anemia, unspecified: Secondary | ICD-10-CM

## 2013-12-01 DIAGNOSIS — M810 Age-related osteoporosis without current pathological fracture: Secondary | ICD-10-CM

## 2013-12-01 HISTORY — DX: Unspecified benign mammary dysplasia of left breast: N60.92

## 2013-12-01 LAB — CBC WITH DIFFERENTIAL (CANCER CENTER ONLY)
BASO#: 0 10*3/uL (ref 0.0–0.2)
BASO%: 0.6 % (ref 0.0–2.0)
EOS ABS: 0.1 10*3/uL (ref 0.0–0.5)
EOS%: 2.3 % (ref 0.0–7.0)
HCT: 37 % (ref 34.8–46.6)
HGB: 11.6 g/dL (ref 11.6–15.9)
LYMPH#: 1.3 10*3/uL (ref 0.9–3.3)
LYMPH%: 25.7 % (ref 14.0–48.0)
MCH: 29.5 pg (ref 26.0–34.0)
MCHC: 31.4 g/dL — ABNORMAL LOW (ref 32.0–36.0)
MCV: 94 fL (ref 81–101)
MONO#: 0.7 10*3/uL (ref 0.1–0.9)
MONO%: 13 % (ref 0.0–13.0)
NEUT%: 58.4 % (ref 39.6–80.0)
NEUTROS ABS: 3 10*3/uL (ref 1.5–6.5)
PLATELETS: 201 10*3/uL (ref 145–400)
RBC: 3.93 10*6/uL (ref 3.70–5.32)
RDW: 13.3 % (ref 11.1–15.7)
WBC: 5.1 10*3/uL (ref 3.9–10.0)

## 2013-12-01 NOTE — Progress Notes (Signed)
This office note has been dictated.

## 2013-12-02 LAB — COMPREHENSIVE METABOLIC PANEL
ALBUMIN: 3.8 g/dL (ref 3.5–5.2)
ALK PHOS: 104 U/L (ref 39–117)
ALT: 11 U/L (ref 0–35)
AST: 17 U/L (ref 0–37)
BILIRUBIN TOTAL: 0.6 mg/dL (ref 0.3–1.2)
BUN: 18 mg/dL (ref 6–23)
CO2: 28 meq/L (ref 19–32)
Calcium: 9.3 mg/dL (ref 8.4–10.5)
Chloride: 103 mEq/L (ref 96–112)
Creatinine, Ser: 0.83 mg/dL (ref 0.50–1.10)
GLUCOSE: 96 mg/dL (ref 70–99)
POTASSIUM: 3.5 meq/L (ref 3.5–5.3)
SODIUM: 143 meq/L (ref 135–145)
TOTAL PROTEIN: 6.8 g/dL (ref 6.0–8.3)

## 2013-12-02 LAB — VITAMIN D 25 HYDROXY (VIT D DEFICIENCY, FRACTURES): Vit D, 25-Hydroxy: 40 ng/mL (ref 30–89)

## 2013-12-02 NOTE — Progress Notes (Signed)
CC:   Gilford Rile, MD  DIAGNOSES: 1. Atypical ductal hyperplasia of the left breast. 2. Chronic atrial fibrillation.  CURRENT THERAPY: 1. Aromasin 25 mg p.o. daily. 2. Xarelto 20 mg p.o. daily.  INTERIM HISTORY:  [name] comes in for her followup.  We see her yearly.  She has had no problems since I last saw her.  She has had knee problems.  She got her knees fixed.  She is walking around a little bit better right now.  She did have a mammogram done yesterday.  This was done at [facility] in [facility].  Mammogram did not show any evidence of suspicious microcalcifications.  She has had no problem with bleeding.  She has had some weight gain. She does drink a lot of soda.  She has had no increased cough or shortness of breath.  She continues on quite a few medications.  PHYSICAL EXAMINATION:  General:  This is an obese white female in no obvious distress.  Vital Signs:  Temperature 98.2, pulse 57, respiratory rate 18, blood pressure 131/52, weight is 240 pounds.  Head and Neck: Normocephalic, atraumatic skull.  There are no ocular or oral lesions. There are no palpable cervical or supraclavicular lymph nodes.  Lungs: Clear bilaterally.  She has no rales, wheezes, or rhonchi.  Cardiac: Irregular rate and irregular rhythm consistent with atrial fibrillation. She has 1/6 systolic ejection murmur.  Breast exam shows right breast with no masses, edema, or erythema.  There is no right axillary adenopathy.  Left breast shows well-healed lumpectomy at the 4 o'clock position.  There may be some slight firmness at the lumpectomy site.  No distinct masses noted in the left breast.  There is no left axillary adenopathy.  Abdomen:  Soft.  She is moderately obese.  She has no fluid wave.  There is no palpable hepatosplenomegaly.  Back exam:  No tenderness over the spine, ribs, or hips.  There is no kyphosis. Extremities:  Show compression stockings on both legs.  She  has surgical scars on the knees.  She has some osteoarthritic changes in her joints. Skin:  No rashes, ecchymosis, or petechia.  LABORATORY STUDIES:  White cell count 5.1, hemoglobin 11.6, hematocrit 37, platelet count 201.  IMPRESSION:  [name] is a very charming 73 year old white female with history of atypical ductal hyperplasia of the left breast.  She is probably __________ 6 years.  Again, I do not see any problems from standpoint of malignancy.  I think she will need to continue on the Aromasin because she is at high risk.  We will go ahead and plan to get her back to see [place] in another year.    ______________________________ Kathryn Steele, M.D. PRE/MEDQ  D:  12/01/2013  T:  12/02/2013  Job:  [id number]

## 2013-12-03 ENCOUNTER — Telehealth: Payer: Self-pay | Admitting: Nurse Practitioner

## 2013-12-03 NOTE — Telephone Encounter (Addendum)
Message copied by Jimmy Footman on Fri Dec 03, 2013  4:19 PM ------      Message from: Burney Gauze R      Created: Thu Dec 02, 2013  4:24 PM       Call - labs are great!! Have a great year!!!  Laurey Arrow ------Pt verbalized understanding and appreciation.

## 2014-11-23 DIAGNOSIS — R0602 Shortness of breath: Secondary | ICD-10-CM | POA: Insufficient documentation

## 2014-11-28 ENCOUNTER — Telehealth: Payer: Self-pay | Admitting: Hematology & Oncology

## 2014-11-28 NOTE — Telephone Encounter (Signed)
Pt moved 1-13 to 2-17 due to husband having appointment same day.

## 2014-11-30 ENCOUNTER — Other Ambulatory Visit: Payer: Medicare Other | Admitting: Lab

## 2014-11-30 ENCOUNTER — Ambulatory Visit: Payer: Medicare Other | Admitting: Hematology & Oncology

## 2015-01-03 ENCOUNTER — Other Ambulatory Visit: Payer: Self-pay | Admitting: *Deleted

## 2015-01-03 DIAGNOSIS — N6092 Unspecified benign mammary dysplasia of left breast: Secondary | ICD-10-CM

## 2015-01-04 ENCOUNTER — Ambulatory Visit (HOSPITAL_BASED_OUTPATIENT_CLINIC_OR_DEPARTMENT_OTHER): Payer: Medicare Other | Admitting: Hematology & Oncology

## 2015-01-04 ENCOUNTER — Encounter: Payer: Self-pay | Admitting: Hematology & Oncology

## 2015-01-04 ENCOUNTER — Other Ambulatory Visit (HOSPITAL_BASED_OUTPATIENT_CLINIC_OR_DEPARTMENT_OTHER): Payer: Medicare Other | Admitting: Lab

## 2015-01-04 VITALS — BP 141/81 | HR 75 | Temp 98.1°F | Resp 16 | Ht 63.0 in | Wt 236.0 lb

## 2015-01-04 DIAGNOSIS — N62 Hypertrophy of breast: Secondary | ICD-10-CM

## 2015-01-04 DIAGNOSIS — D0590 Unspecified type of carcinoma in situ of unspecified breast: Secondary | ICD-10-CM

## 2015-01-04 DIAGNOSIS — I4891 Unspecified atrial fibrillation: Secondary | ICD-10-CM

## 2015-01-04 DIAGNOSIS — N6099 Unspecified benign mammary dysplasia of unspecified breast: Secondary | ICD-10-CM

## 2015-01-04 DIAGNOSIS — N6092 Unspecified benign mammary dysplasia of left breast: Secondary | ICD-10-CM

## 2015-01-04 LAB — CBC WITH DIFFERENTIAL (CANCER CENTER ONLY)
BASO#: 0 10*3/uL (ref 0.0–0.2)
BASO%: 0.4 % (ref 0.0–2.0)
EOS ABS: 0.1 10*3/uL (ref 0.0–0.5)
EOS%: 2.4 % (ref 0.0–7.0)
HCT: 37.2 % (ref 34.8–46.6)
HGB: 11.8 g/dL (ref 11.6–15.9)
LYMPH#: 1.6 10*3/uL (ref 0.9–3.3)
LYMPH%: 34.4 % (ref 14.0–48.0)
MCH: 30.5 pg (ref 26.0–34.0)
MCHC: 31.7 g/dL — ABNORMAL LOW (ref 32.0–36.0)
MCV: 96 fL (ref 81–101)
MONO#: 0.6 10*3/uL (ref 0.1–0.9)
MONO%: 12.7 % (ref 0.0–13.0)
NEUT%: 50.1 % (ref 39.6–80.0)
NEUTROS ABS: 2.3 10*3/uL (ref 1.5–6.5)
PLATELETS: 213 10*3/uL (ref 145–400)
RBC: 3.87 10*6/uL (ref 3.70–5.32)
RDW: 13.2 % (ref 11.1–15.7)
WBC: 4.5 10*3/uL (ref 3.9–10.0)

## 2015-01-04 LAB — COMPREHENSIVE METABOLIC PANEL
ALT: 10 U/L (ref 0–35)
AST: 17 U/L (ref 0–37)
Albumin: 4 g/dL (ref 3.5–5.2)
Alkaline Phosphatase: 91 U/L (ref 39–117)
BUN: 13 mg/dL (ref 6–23)
CALCIUM: 9.8 mg/dL (ref 8.4–10.5)
CHLORIDE: 102 meq/L (ref 96–112)
CO2: 28 mEq/L (ref 19–32)
Creatinine, Ser: 0.9 mg/dL (ref 0.50–1.10)
Glucose, Bld: 94 mg/dL (ref 70–99)
POTASSIUM: 3.8 meq/L (ref 3.5–5.3)
Sodium: 142 mEq/L (ref 135–145)
Total Bilirubin: 1 mg/dL (ref 0.2–1.2)
Total Protein: 6.8 g/dL (ref 6.0–8.3)

## 2015-01-04 MED ORDER — EXEMESTANE 25 MG PO TABS: 25.0000 mg | ORAL_TABLET | ORAL | Status: DC

## 2015-01-04 MED ORDER — VITAMIN D (ERGOCALCIFEROL) 1.25 MG (50000 UNIT) PO CAPS: 50000.0000 [IU] | ORAL_CAPSULE | ORAL | Status: DC

## 2015-01-04 NOTE — Progress Notes (Signed)
Hematology and Oncology Follow Up Visit  Kathryn Steele [id number] 11/29/1940 74 y.o. 01/04/2015   Principle Diagnosis:   Atypical ductal hyperplasia of the left breast  Chronic atrial fibrillation  Current Therapy:    Aromasin 25 mg by mouth daily  Xarelto 20 mg by mouth daily     Interim History:  [name] is back for follow-up. We see her yearly. She has been doing pretty well since we last saw her. Unfortunately, her husband has recurrence of his mantle cell lymphoma. He now is on oral therapy for this.  Kathryn Steele has been getting around okay. She's had bilateral knee surgery area and I think his been 2 or 3 years since she had her knees operated on.  She's had no problems with nausea or vomiting. She's had no leg swelling. She does wear compression stockings. She does have the atrial ablation. This has not caused any issues with heart failure.  She's had no cough. There's been no dyspnea. She's had no change in bowel or bladder habits.  She's not noted any rashes. There's been no bleeding.  Medications:  Current outpatient prescriptions:  .  ACIPHEX 20 MG tablet, Take 20 mg by mouth daily. Does not take as directed, Disp: , Rfl:  .  ALBUTEROL IN, Inhale into the lungs every morning., Disp: , Rfl:  .  amLODipine (NORVASC) 5 MG tablet, Take 5 mg by mouth daily. , Disp: , Rfl:  .  aspirin EC 81 MG tablet, Take 81 mg by mouth daily., Disp: , Rfl:  .  enalapril (VASOTEC) 10 MG tablet, 2 (two) times daily. , Disp: , Rfl:  .  exemestane (AROMASIN) 25 MG tablet, Take 1 tablet (25 mg total) by mouth every morning., Disp: 30 tablet, Rfl: 12 .  furosemide (LASIX) 40 MG tablet, Take 40 mg by mouth 2 (two) times daily. , Disp: , Rfl:  .  HYDROcodone-acetaminophen (LORTAB) 7.5-500 MG per tablet, Take 1 tablet by mouth every 6 (six) hours as needed., Disp: , Rfl:  .  LIPITOR 20 MG tablet, Take 20 mg by mouth daily. , Disp: , Rfl:  .  metoprolol (LOPRESSOR) 50 MG tablet, 2  (two) times daily. Pt takes she takes 1/2 bid, Disp: , Rfl:  .  nitroGLYCERIN (NITRODUR - DOSED IN MG/24 HR) 0.4 mg/hr, Place 1 patch onto the skin daily. , Disp: , Rfl:  .  Potassium Bicarb-Citric Acid 10 MEQ TBEF, Take by mouth 2 (two) times daily. 3 teaspoons bid, Disp: , Rfl:  .  Vitamin D, Ergocalciferol, (DRISDOL) 50000 UNITS CAPS capsule, Take 1 capsule (50,000 Units total) by mouth every 7 (seven) days., Disp: 12 capsule, Rfl: 3 .  XARELTO 20 MG TABS, Take 20 mg by mouth daily. , Disp: , Rfl:   Allergies:  Allergies  Allergen Reactions  . Xanax [Alprazolam]     palpitations  . Other     Pt states she is allergic to some drug but can't remember- she will call us with info.  . Iodinated Diagnostic Agents     Skin burns  . Sulfa Antibiotics     Pain in ears    Past Medical History, Surgical history, Social history, and Family History were reviewed and updated.  Review of Systems: As above  Physical Exam:  height is 5\' 3"  (1.6 m) and weight is 236 lb (107.049 kg). Her oral temperature is 98.1 F (36.7 C). Her blood pressure is 141/81 and her pulse is 75. Her respiration is  66.   Obese white female in no obvious distress. Head and neck exam shows no ocular or oral lesions. She has no palpable cervical or supraclavicular lymph nodes. Lungs are clear cardiac exam irregular rate and rhythm consistent with atrial fibrillation. There are no murmurs, rubs or bruits. Abdomen is soft. She is obese. She has good bowel sounds. There is no fluid wave. There is no palpable liver or spleen tip. Breast exam shows right breast with no masses, edema or erythema. There is no right axillary adenopathy. Left breast shows well-healed lumpectomy at the 4:00 position. There is some slight firmness of the lumpectomy site. No distinct masses noted in the left breast. There is no left axillary adenopathy. Back exam shows no tenderness over the spine, ribs or hips. Extremities shows no clubbing, cyanosis or  edema. She has compression stockings on her legs. She has surgical scars on both knees. She has good range of motion of her joints. Skin exam shows no rashes, ecchymoses or petechia. Neurological exam shows no focal neurological deficit.  Lab Results  Component Value Date   WBC 4.5 01/04/2015   HGB 11.8 01/04/2015   HCT 37.2 01/04/2015   MCV 96 01/04/2015   PLT 213 01/04/2015     Chemistry      Component Value Date/Time   NA 143 12/01/2013 0851   K 3.5 12/01/2013 0851   CL 103 12/01/2013 0851   CO2 28 12/01/2013 0851   BUN 18 12/01/2013 0851   CREATININE 0.83 12/01/2013 0851      Component Value Date/Time   CALCIUM 9.3 12/01/2013 0851   ALKPHOS 104 12/01/2013 0851   AST 17 12/01/2013 0851   ALT 11 12/01/2013 0851   BILITOT 0.6 12/01/2013 0851         Impression and Plan: [name] is 74 year old white female. She has atypical ductal hyperplasia left breast. We have been following this now for over 6 years. There's been no evidence of progression to malignancy.  We will continue her on the Aromasin. She's done very well with this.  I will plan to see her back in one year.  I told her that I would be would have her do see her husband if these become too complicated for her and him over at [place], MD 2/17/20166:15 PM

## 2015-02-15 DIAGNOSIS — J4540 Moderate persistent asthma, uncomplicated: Secondary | ICD-10-CM | POA: Insufficient documentation

## 2015-12-25 ENCOUNTER — Other Ambulatory Visit: Payer: Self-pay | Admitting: Hematology & Oncology

## 2015-12-29 ENCOUNTER — Telehealth: Payer: Self-pay | Admitting: Hematology & Oncology

## 2015-12-29 NOTE — Telephone Encounter (Signed)
Patient called wanting to move this appt to February 14, 2016.       [facility].

## 2016-01-03 ENCOUNTER — Ambulatory Visit: Payer: Medicare Other | Admitting: Hematology & Oncology

## 2016-01-03 ENCOUNTER — Other Ambulatory Visit: Payer: Medicare Other

## 2016-02-13 ENCOUNTER — Other Ambulatory Visit: Payer: Self-pay | Admitting: *Deleted

## 2016-02-13 DIAGNOSIS — N6092 Unspecified benign mammary dysplasia of left breast: Secondary | ICD-10-CM

## 2016-02-13 DIAGNOSIS — E559 Vitamin D deficiency, unspecified: Secondary | ICD-10-CM

## 2016-02-14 ENCOUNTER — Ambulatory Visit (HOSPITAL_BASED_OUTPATIENT_CLINIC_OR_DEPARTMENT_OTHER): Payer: Medicare Other | Admitting: Hematology & Oncology

## 2016-02-14 ENCOUNTER — Other Ambulatory Visit: Payer: Self-pay | Admitting: Family

## 2016-02-14 ENCOUNTER — Other Ambulatory Visit (HOSPITAL_BASED_OUTPATIENT_CLINIC_OR_DEPARTMENT_OTHER): Payer: Medicare Other

## 2016-02-14 ENCOUNTER — Encounter: Payer: Self-pay | Admitting: Hematology & Oncology

## 2016-02-14 VITALS — BP 113/58 | HR 69 | Temp 98.1°F | Resp 16 | Ht 63.0 in | Wt 240.0 lb

## 2016-02-14 DIAGNOSIS — N644 Mastodynia: Secondary | ICD-10-CM | POA: Diagnosis not present

## 2016-02-14 DIAGNOSIS — I4891 Unspecified atrial fibrillation: Secondary | ICD-10-CM

## 2016-02-14 DIAGNOSIS — E559 Vitamin D deficiency, unspecified: Secondary | ICD-10-CM

## 2016-02-14 DIAGNOSIS — N6092 Unspecified benign mammary dysplasia of left breast: Secondary | ICD-10-CM

## 2016-02-14 DIAGNOSIS — N62 Hypertrophy of breast: Secondary | ICD-10-CM

## 2016-02-14 LAB — CBC WITH DIFFERENTIAL (CANCER CENTER ONLY)
BASO#: 0 10*3/uL (ref 0.0–0.2)
BASO%: 0.4 % (ref 0.0–2.0)
EOS ABS: 0.1 10*3/uL (ref 0.0–0.5)
EOS%: 1.6 % (ref 0.0–7.0)
HCT: 38.6 % (ref 34.8–46.6)
HEMOGLOBIN: 12.3 g/dL (ref 11.6–15.9)
LYMPH#: 1.4 10*3/uL (ref 0.9–3.3)
LYMPH%: 26.8 % (ref 14.0–48.0)
MCH: 30.9 pg (ref 26.0–34.0)
MCHC: 31.9 g/dL — ABNORMAL LOW (ref 32.0–36.0)
MCV: 97 fL (ref 81–101)
MONO#: 0.6 10*3/uL (ref 0.1–0.9)
MONO%: 12.4 % (ref 0.0–13.0)
NEUT#: 3 10*3/uL (ref 1.5–6.5)
NEUT%: 58.8 % (ref 39.6–80.0)
PLATELETS: 182 10*3/uL (ref 145–400)
RBC: 3.98 10*6/uL (ref 3.70–5.32)
RDW: 13.1 % (ref 11.1–15.7)
WBC: 5.1 10*3/uL (ref 3.9–10.0)

## 2016-02-14 LAB — COMPREHENSIVE METABOLIC PANEL
ALBUMIN: 3.6 g/dL (ref 3.5–5.0)
ALT: 12 U/L (ref 0–55)
ANION GAP: 11 meq/L (ref 3–11)
AST: 17 U/L (ref 5–34)
Alkaline Phosphatase: 93 U/L (ref 40–150)
BILIRUBIN TOTAL: 0.92 mg/dL (ref 0.20–1.20)
BUN: 16.9 mg/dL (ref 7.0–26.0)
CALCIUM: 10.1 mg/dL (ref 8.4–10.4)
CO2: 29 mEq/L (ref 22–29)
Chloride: 104 mEq/L (ref 98–109)
Creatinine: 1 mg/dL (ref 0.6–1.1)
EGFR: 57 mL/min/{1.73_m2} — AB (ref >90)
Glucose: 88 mg/dl (ref 70–140)
Potassium: 3.7 mEq/L (ref 3.5–5.1)
Sodium: 143 mEq/L (ref 136–145)
TOTAL PROTEIN: 7.2 g/dL (ref 6.4–8.3)

## 2016-02-15 ENCOUNTER — Other Ambulatory Visit: Payer: Self-pay | Admitting: Family

## 2016-02-15 DIAGNOSIS — N6092 Unspecified benign mammary dysplasia of left breast: Secondary | ICD-10-CM

## 2016-02-15 LAB — VITAMIN D 25 HYDROXY (VIT D DEFICIENCY, FRACTURES): Vitamin D, 25-Hydroxy: 46.4 ng/mL (ref 30.0–100.0)

## 2016-02-15 NOTE — Progress Notes (Signed)
Hematology and Oncology Follow Up Visit  TEAGUE POSTEN [id number] 15-Nov-1941 75 y.o. 02/15/2016   Principle Diagnosis:   Atypical ductal hyperplasia of the left breast  Chronic atrial fibrillation  Current Therapy:    Aromasin 25 mg by mouth daily  Xarelto 20 mg by mouth daily     Interim History:  Ms.  Raitz is back for follow-up. We see her yearly. She has been doing pretty well since we last saw her. Unfortunately, her husband has recurrence of his mantle cell lymphoma. He now is on oral therapy for this.  Ms. Oconnell has been getting around okay. She's had bilateral knee surgery . I think his been 3 years since she had her knees operated on.  She's had no problems with nausea or vomiting. She's had no leg swelling. She does wear compression stockings. She does have the atrial fibrillation. This has not caused any issues with heart failure.  She's had no cough. There's been no dyspnea. She's had no change in bowel or bladder habits.  She's not noted any rashes. There's been no bleeding.  Medications:  Current outpatient prescriptions:  .  ACIPHEX 20 MG tablet, Take 20 mg by mouth daily. Does not take as directed, Disp: , Rfl:  .  albuterol (PROAIR HFA) 108 (90 Base) MCG/ACT inhaler, Inhale into the lungs., Disp: , Rfl:  .  ALBUTEROL IN, Inhale into the lungs every morning., Disp: , Rfl:  .  amLODipine (NORVASC) 5 MG tablet, Take 5 mg by mouth daily. , Disp: , Rfl:  .  aspirin EC 81 MG tablet, Take 81 mg by mouth daily., Disp: , Rfl:  .  BREO ELLIPTA 100-25 MCG/INH AEPB, , Disp: , Rfl:  .  enalapril (VASOTEC) 10 MG tablet, 2 (two) times daily. , Disp: , Rfl:  .  exemestane (AROMASIN) 25 MG tablet, Take 1 tablet (25 mg total) by mouth every morning., Disp: 30 tablet, Rfl: 12 .  flecainide (TAMBOCOR) 50 MG tablet, , Disp: , Rfl:  .  furosemide (LASIX) 40 MG tablet, Take 40 mg by mouth 2 (two) times daily. , Disp: , Rfl:  .  HYDROcodone-acetaminophen (LORTAB) 7.5-500  MG per tablet, Take 1 tablet by mouth every 6 (six) hours as needed., Disp: , Rfl:  .  LIPITOR 20 MG tablet, Take 20 mg by mouth daily. , Disp: , Rfl:  .  metoprolol (LOPRESSOR) 50 MG tablet, 2 (two) times daily. Pt takes she takes 1/2 bid, Disp: , Rfl:  .  nitroGLYCERIN (NITRODUR - DOSED IN MG/24 HR) 0.4 mg/hr, Place 1 patch onto the skin daily. , Disp: , Rfl:  .  Potassium Bicarb-Citric Acid 10 MEQ TBEF, Take by mouth 2 (two) times daily. 3 teaspoons bid, Disp: , Rfl:  .  potassium chloride SA (K-DUR,KLOR-CON) 20 MEQ tablet, , Disp: , Rfl:  .  Vitamin D, Ergocalciferol, (DRISDOL) 50000 units CAPS capsule, TAKE 1 CAPSULE BY MOUTH EVERY 7 DAYS, Disp: 12 capsule, Rfl: 3 .  XARELTO 20 MG TABS, Take 20 mg by mouth daily. , Disp: , Rfl:   Allergies:  Allergies  Allergen Reactions  . Xanax [Alprazolam]     palpitations  . Other     Pt states she is allergic to some drug but can't remember- she will call us with info.  . Iodinated Diagnostic Agents     Skin burns  . Sulfa Antibiotics     Pain in ears    Past Medical History, Surgical history, Social history, and Family  History were reviewed and updated.  Review of Systems: As above  Physical Exam:  height is 5\' 3"  (1.6 m) and weight is 240 lb (108.863 kg). Her oral temperature is 98.1 F (36.7 C). Her blood pressure is 113/58 and her pulse is 69. Her respiration is 16.   Obese white female in no obvious distress. Head and neck exam shows no ocular or oral lesions. She has no palpable cervical or supraclavicular lymph nodes. Lungs are clear cardiac exam irregular rate and rhythm consistent with atrial fibrillation. There are no murmurs, rubs or bruits. Abdomen is soft. She is obese. She has good bowel sounds. There is no fluid wave. There is no palpable liver or spleen tip. Breast exam shows right breast with no masses, edema or erythema. There is no right axillary adenopathy. Left breast shows well-healed lumpectomy at the 4:00 position.  There is some slight firmness of the lumpectomy site. No distinct masses noted in the left breast. There is no left axillary adenopathy. Back exam shows no tenderness over the spine, ribs or hips. Extremities shows no clubbing, cyanosis or edema. She has compression stockings on her legs. She has surgical scars on both knees. She has good range of motion of her joints. Skin exam shows no rashes, ecchymoses or petechia. Neurological exam shows no focal neurological deficit.  Lab Results  Component Value Date   WBC 5.1 02/14/2016   HGB 12.3 02/14/2016   HCT 38.6 02/14/2016   MCV 97 02/14/2016   PLT 182 02/14/2016     Chemistry      Component Value Date/Time   NA 143 02/14/2016 0910   NA 142 01/04/2015 0824   K 3.7 02/14/2016 0910   K 3.8 01/04/2015 0824   CL 102 01/04/2015 0824   CO2 29 02/14/2016 0910   CO2 28 01/04/2015 0824   BUN 16.9 02/14/2016 0910   BUN 13 01/04/2015 0824   CREATININE 1.0 02/14/2016 0910   CREATININE 0.90 01/04/2015 0824      Component Value Date/Time   CALCIUM 10.1 02/14/2016 0910   CALCIUM 9.8 01/04/2015 0824   ALKPHOS 93 02/14/2016 0910   ALKPHOS 91 01/04/2015 0824   AST 17 02/14/2016 0910   AST 17 01/04/2015 0824   ALT 12 02/14/2016 0910   ALT 10 01/04/2015 0824   BILITOT 0.92 02/14/2016 0910   BILITOT 1.0 01/04/2015 0824         Impression and Plan: Ms. Thornes is 75 year old white female. She has atypical ductal hyperplasia left breast. We have been following this now for over 6 years. There's been no evidence of progression to malignancy.  We will continue her on the Aromasin. She's done very well with this.  I will plan to see her back in one year.  I told her that I would be would have her do see her husband if these become too complicated for her and him over at [place], MD 3/30/20176:37 PM

## 2016-02-28 DIAGNOSIS — R600 Localized edema: Secondary | ICD-10-CM | POA: Insufficient documentation

## 2016-02-28 DIAGNOSIS — I2510 Atherosclerotic heart disease of native coronary artery without angina pectoris: Secondary | ICD-10-CM | POA: Insufficient documentation

## 2016-02-28 DIAGNOSIS — C189 Malignant neoplasm of colon, unspecified: Secondary | ICD-10-CM | POA: Insufficient documentation

## 2016-02-28 DIAGNOSIS — Z85038 Personal history of other malignant neoplasm of large intestine: Secondary | ICD-10-CM | POA: Insufficient documentation

## 2016-02-28 DIAGNOSIS — K219 Gastro-esophageal reflux disease without esophagitis: Secondary | ICD-10-CM | POA: Insufficient documentation

## 2016-02-28 DIAGNOSIS — E66812 Obesity, class 2: Secondary | ICD-10-CM | POA: Insufficient documentation

## 2016-02-28 DIAGNOSIS — Z79899 Other long term (current) drug therapy: Secondary | ICD-10-CM | POA: Insufficient documentation

## 2016-02-28 DIAGNOSIS — N183 Chronic kidney disease, stage 3 unspecified: Secondary | ICD-10-CM | POA: Insufficient documentation

## 2016-02-28 DIAGNOSIS — E559 Vitamin D deficiency, unspecified: Secondary | ICD-10-CM | POA: Insufficient documentation

## 2016-02-28 DIAGNOSIS — E782 Mixed hyperlipidemia: Secondary | ICD-10-CM | POA: Insufficient documentation

## 2016-02-28 DIAGNOSIS — Z7901 Long term (current) use of anticoagulants: Secondary | ICD-10-CM | POA: Insufficient documentation

## 2016-02-28 DIAGNOSIS — M159 Polyosteoarthritis, unspecified: Secondary | ICD-10-CM | POA: Insufficient documentation

## 2016-03-05 ENCOUNTER — Ambulatory Visit
Admission: RE | Admit: 2016-03-05 | Discharge: 2016-03-05 | Disposition: A | Discharge status: Home / Self Care | Payer: Medicare Other | Source: Ambulatory Visit | Attending: Family | Admitting: Family

## 2016-03-05 DIAGNOSIS — N6092 Unspecified benign mammary dysplasia of left breast: Secondary | ICD-10-CM

## 2016-03-05 MED ORDER — GADOBENATE DIMEGLUMINE 529 MG/ML IV SOLN
20.0000 mL | Freq: Once | INTRAVENOUS | Status: AC | PRN
  Administered 2016-03-05: 20 mL via INTRAVENOUS

## 2016-03-12 ENCOUNTER — Other Ambulatory Visit: Payer: Self-pay | Admitting: Family

## 2016-03-12 DIAGNOSIS — N6092 Unspecified benign mammary dysplasia of left breast: Secondary | ICD-10-CM

## 2016-03-19 ENCOUNTER — Other Ambulatory Visit: Payer: Self-pay | Admitting: Family

## 2016-03-19 ENCOUNTER — Ambulatory Visit
Admission: RE | Admit: 2016-03-19 | Discharge: 2016-03-19 | Disposition: A | Discharge status: Home / Self Care | Payer: Medicare Other | Source: Ambulatory Visit | Attending: Family | Admitting: Family

## 2016-03-19 DIAGNOSIS — N6092 Unspecified benign mammary dysplasia of left breast: Secondary | ICD-10-CM

## 2016-04-09 ENCOUNTER — Ambulatory Visit
Admission: RE | Admit: 2016-04-09 | Discharge: 2016-04-09 | Disposition: A | Discharge status: Home / Self Care | Payer: Medicare Other | Source: Ambulatory Visit | Attending: Family | Admitting: Family

## 2016-04-09 ENCOUNTER — Other Ambulatory Visit: Payer: Self-pay | Admitting: Family

## 2016-04-09 DIAGNOSIS — N6092 Unspecified benign mammary dysplasia of left breast: Secondary | ICD-10-CM

## 2016-04-11 ENCOUNTER — Other Ambulatory Visit: Payer: Self-pay | Admitting: Family

## 2016-04-11 DIAGNOSIS — N6092 Unspecified benign mammary dysplasia of left breast: Secondary | ICD-10-CM

## 2016-04-12 ENCOUNTER — Other Ambulatory Visit: Payer: Self-pay | Admitting: Hematology & Oncology

## 2016-05-16 DIAGNOSIS — M7752 Other enthesopathy of left foot: Secondary | ICD-10-CM | POA: Insufficient documentation

## 2016-05-16 DIAGNOSIS — Q828 Other specified congenital malformations of skin: Secondary | ICD-10-CM | POA: Insufficient documentation

## 2016-05-16 DIAGNOSIS — I872 Venous insufficiency (chronic) (peripheral): Secondary | ICD-10-CM | POA: Insufficient documentation

## 2016-05-22 IMAGING — MR MR BREAST BILAT WO/W CM
7 of 12 series · 25 of 48 positions shown · IV contrast (Multihance 20 ML)
Comparison: Recent screening mammogram dated 01/24/2016.

CLINICAL DATA: History of biopsy-proven ADH within the left breast
being treated with Aromasin and followed with annual mammography.
Per a clinic note, this has been followed for 6 years without
evidence of progression.

Patient also describes diffuse intermittent left breast pain for 6
months.
LABS:  GFR 57, BUN 16.9, creatinine
EXAM:
BILATERAL BREAST MRI WITH AND WITHOUT CONTRAST
TECHNIQUE: Multiplanar, multisequence MR images of both breasts were obtained
prior to and following the intravenous administration of 20 ml of
MultiHance.

[Series 2: STIR · axial · 3.0mm · 0.94mm/px · 1 of 62 slices shown]
[im 1/62]
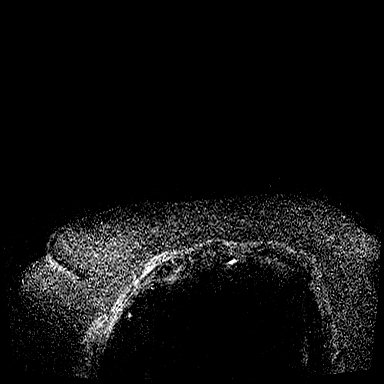

[Series 3: fl3d pre no · axial · non-contrast · 1.0mm · 0.80mm/px · z∈[-37,+138]mm · 4 of 176 slices shown]
[im 1/176]
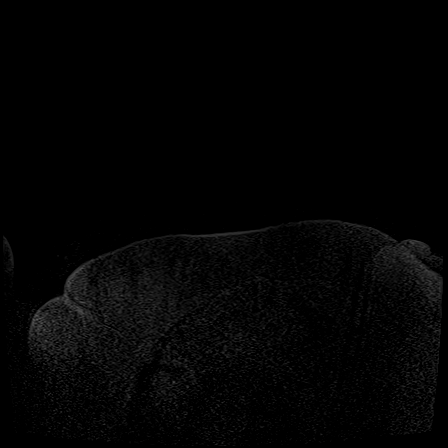
[im 59/176]
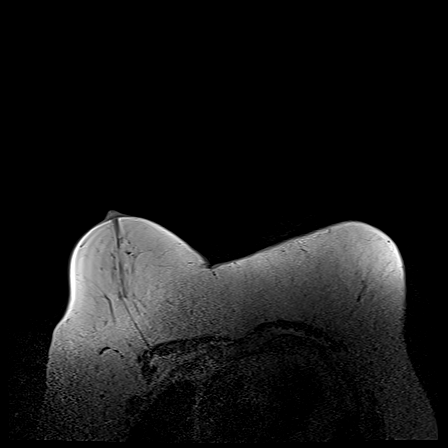
[im 117/176]
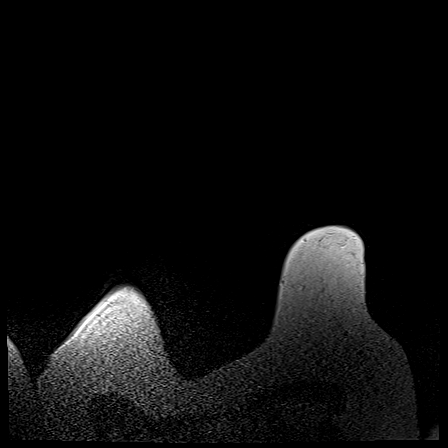
[im 176/176]
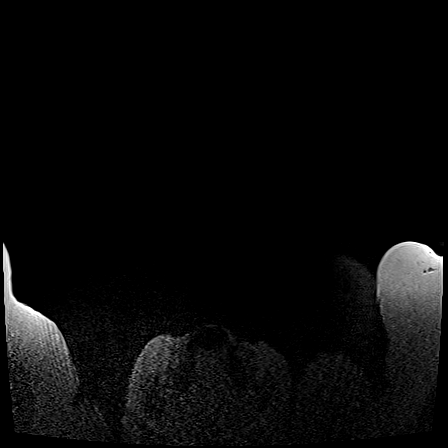

[Series 4: axial pre fs · axial · non-contrast · 1.0mm · 0.80mm/px · z∈[-53,+154]mm · 5 of 208 slices shown]
[im 1/208]
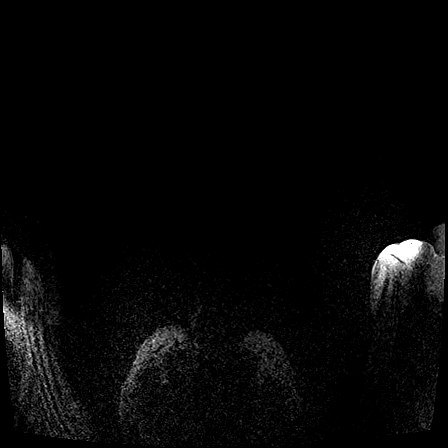
[im 52/208]
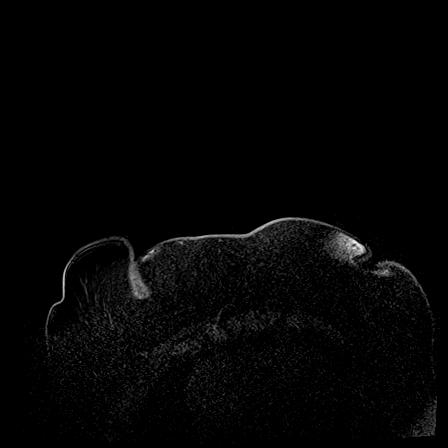
[im 104/208]
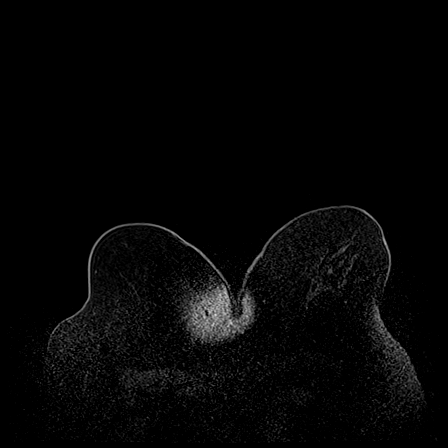
[im 156/208]
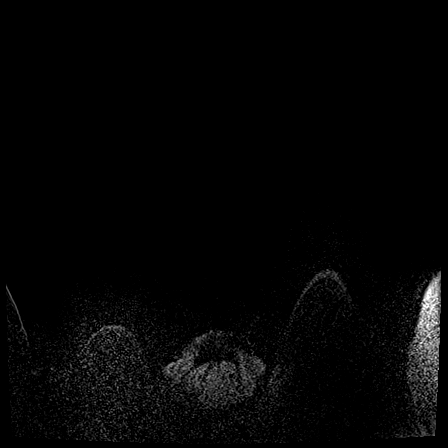
[im 208/208]
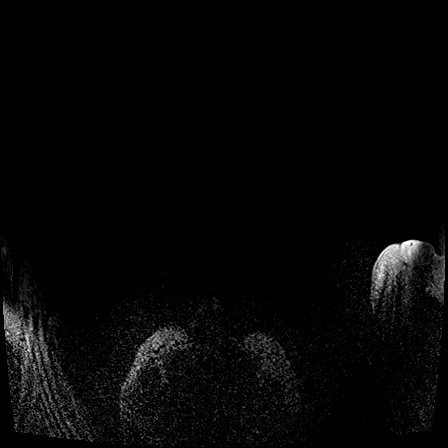

[Series 5: axial post 20 · axial · 1.0mm · 0.80mm/px · z∈[-53,+154]mm · 5 of 208 slices shown (1 of 3)]
[im 1/208]
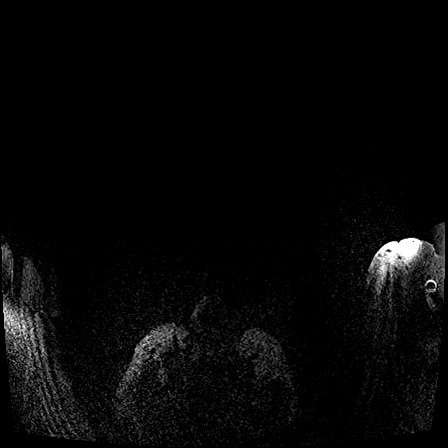
[im 52/208]
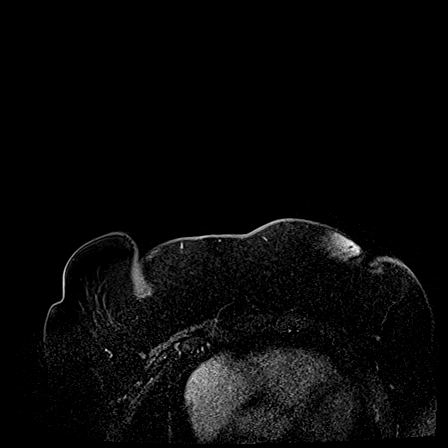
[im 104/208]
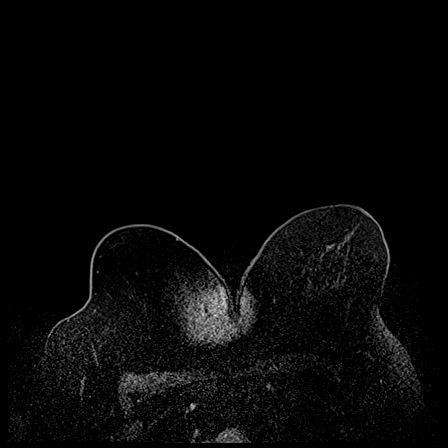
[im 156/208]
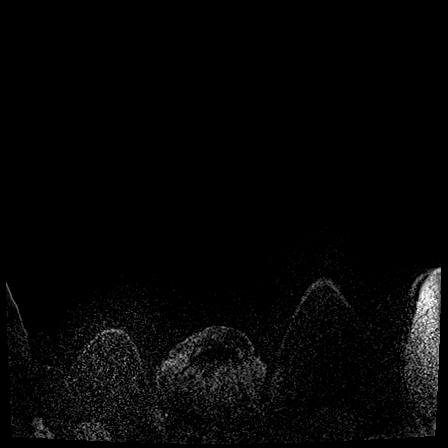
[im 208/208]
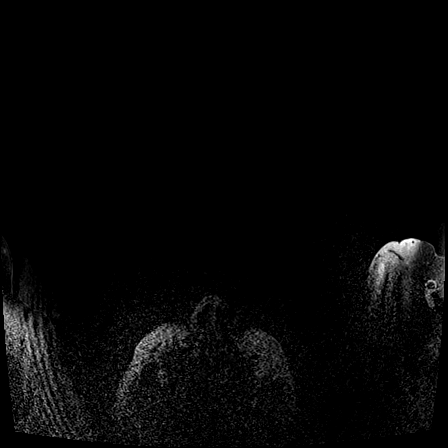

[Series 6: axial post 20 · axial · 1.0mm · 0.80mm/px · z∈[-53,+154]mm · 6 of 208 slices shown (2 of 3)]
[im 1/208]
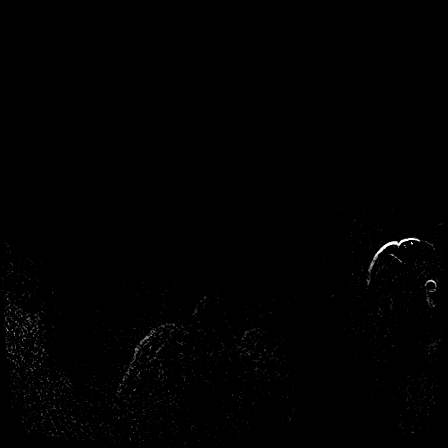
[im 42/208]
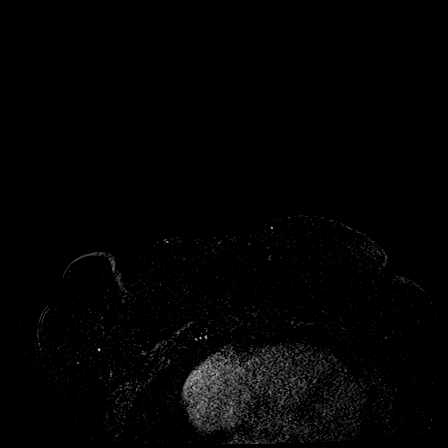
[im 83/208]
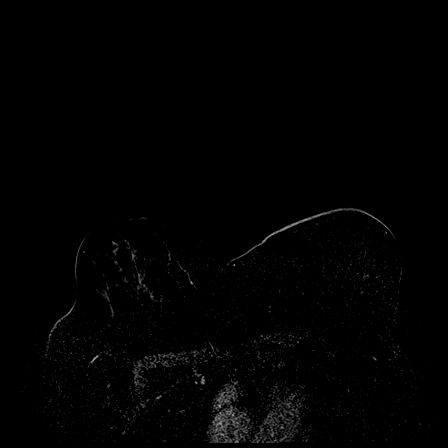
[im 125/208]
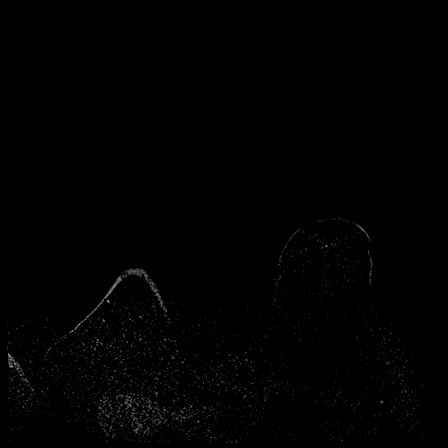
[im 166/208]
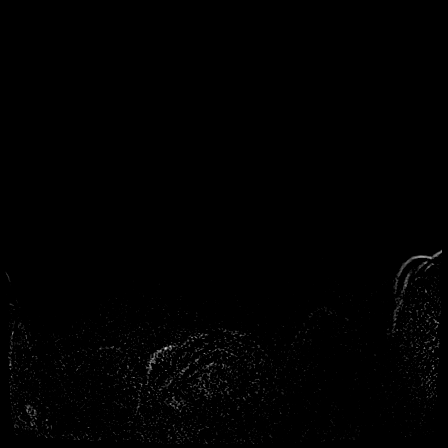
[im 208/208]
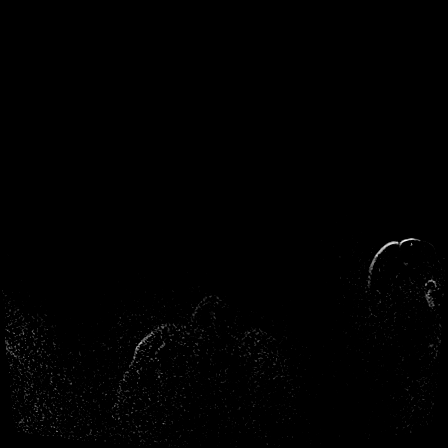

[Series 7: axial post 20 · axial · 208.0mm · 0.80mm/px · 1 of 1 slices shown (3 of 3)]
[im 1/1]
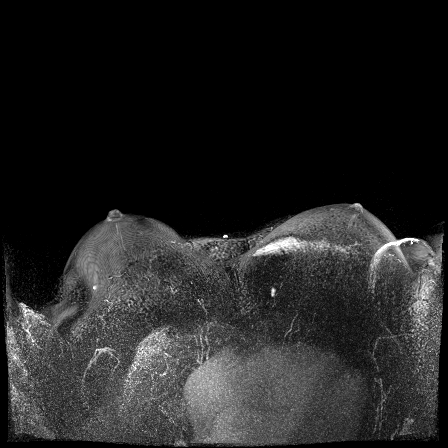

[Series 8: axial post 3 · axial · 1.0mm · 0.80mm/px · z∈[-53,+29]mm · 3 of 208 slices shown]
[im 1/208]
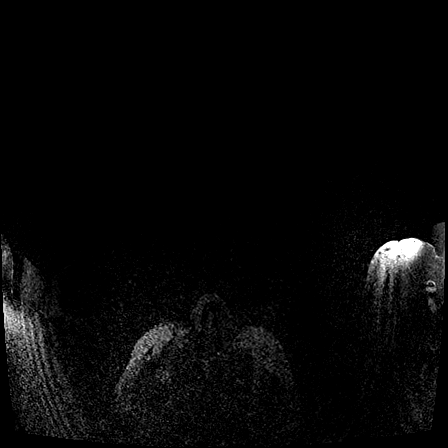
[im 42/208]
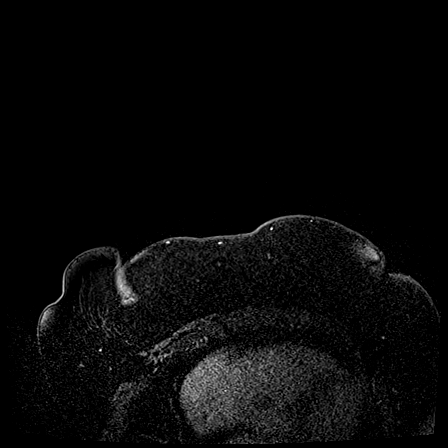
[im 83/208]
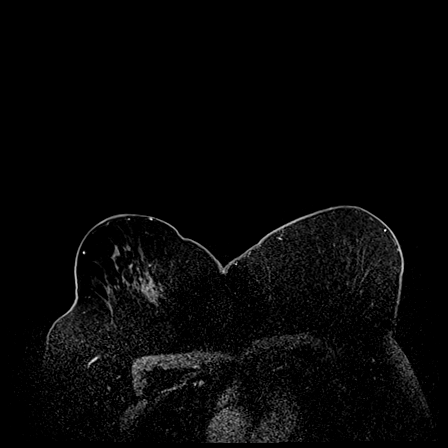

[25 of 48 positions shown; findings below may reference images not displayed]

THREE-DIMENSIONAL MR IMAGE RENDERING ON INDEPENDENT WORKSTATION:

Three-dimensional MR images were rendered by post-processing of the
original MR data on an independent workstation. The
three-dimensional MR images were interpreted, and findings are
reported in the following complete MRI report for this study. Three
dimensional images were evaluated at the independent [employer]
workstation
FINDINGS: Breast composition: b. Scattered fibroglandular tissue.

Background parenchymal enhancement: Minimal

Right breast: No mass or abnormal enhancement.

Left breast: Biopsy clip artifact is seen within the lower inner
quadrant of the left breast, with thin circumferential enhancement
about the clip, compatible with patient's biopsy-proven ADH.

There is an additional oval enhancing mass within the upper inner
quadrant, 9:30 o'clock to 10 o'clock axis region, within a
superficial position at middle to posterior depth, T2 hyperintense,
measuring 7 x 4 mm, demonstrating predominantly washout kinetics
(series 6, image 91). This is suspected to be an intramammary lymph
node but there is no mammographic correlate identified.

Lymph nodes: No abnormal appearing lymph nodes within the bilateral
axillary or internal mammary chain regions.

Ancillary findings:  None.
IMPRESSION: 1. Biopsy clip artifact within the lower inner quadrant of the left
breast, with thin circumferential enhancement about the clip,
compatible with the site of patient's biopsy-proven ADH. No
additional mass or non-mass enhancement is identified adjacent to
the clip or within the remainder of the lower inner quadrant.
2. Additional oval enhancing mass within the upper inner quadrant of
the left breast, 9:30 o'clock to 10 o'clock axis region, superficial
in position, of uncertain etiology but suspected to be an
intramammary lymph node.
3. No evidence of malignancy within the right breast.

RECOMMENDATION:
1. Targeted left breast ultrasound is recommended for the oval
enhancing mass located within the upper inner quadrant, 9:30 o'clock
to 10 o'clock axis region, superficial in position. This is
suspected to be an intramammary lymph node but there is no
mammographic correlate seen on the screening mammogram of
01/24/2016. If no corresponding mass or lymph node is identified
within this area on the targeted left breast ultrasound, recommend
six-month follow-up breast MRI to ensure stability.
2. Current treatment plan for patient's known left breast ADH.

BI-RADS CATEGORY  3: Probably benign.

## 2016-06-11 DIAGNOSIS — M7732 Calcaneal spur, left foot: Secondary | ICD-10-CM | POA: Insufficient documentation

## 2016-06-11 DIAGNOSIS — M216X2 Other acquired deformities of left foot: Secondary | ICD-10-CM | POA: Insufficient documentation

## 2016-07-09 ENCOUNTER — Other Ambulatory Visit: Payer: Self-pay | Admitting: Hematology & Oncology

## 2016-08-30 DIAGNOSIS — R5383 Other fatigue: Secondary | ICD-10-CM | POA: Insufficient documentation

## 2016-08-30 DIAGNOSIS — R5381 Other malaise: Secondary | ICD-10-CM | POA: Insufficient documentation

## 2016-08-30 DIAGNOSIS — J329 Chronic sinusitis, unspecified: Secondary | ICD-10-CM | POA: Insufficient documentation

## 2016-10-17 ENCOUNTER — Other Ambulatory Visit: Payer: Self-pay | Admitting: Hematology & Oncology

## 2017-01-27 ENCOUNTER — Other Ambulatory Visit: Payer: Self-pay | Admitting: Hematology & Oncology

## 2017-02-11 ENCOUNTER — Telehealth: Payer: Self-pay | Admitting: *Deleted

## 2017-02-11 NOTE — Telephone Encounter (Signed)
"  I have an appointment with Dr. Marin Olp on the 29 th.  I need to reschedule.  Is it possible to schedule for April 19 th?"   Advised to call [employer] office tomorrow at 0830 to reach [employer].  "What is their number?"  Confirmed patient has correct number.

## 2017-02-13 ENCOUNTER — Other Ambulatory Visit: Payer: Medicare Other

## 2017-02-13 ENCOUNTER — Ambulatory Visit: Payer: Medicare Other | Admitting: Hematology & Oncology

## 2017-03-06 ENCOUNTER — Ambulatory Visit: Payer: Medicare Other | Admitting: Hematology & Oncology

## 2017-03-06 ENCOUNTER — Other Ambulatory Visit: Payer: Medicare Other

## 2017-03-13 DIAGNOSIS — H353113 Nonexudative age-related macular degeneration, right eye, advanced atrophic without subfoveal involvement: Secondary | ICD-10-CM | POA: Insufficient documentation

## 2017-03-13 DIAGNOSIS — H43813 Vitreous degeneration, bilateral: Secondary | ICD-10-CM | POA: Insufficient documentation

## 2017-03-13 DIAGNOSIS — H25811 Combined forms of age-related cataract, right eye: Secondary | ICD-10-CM | POA: Insufficient documentation

## 2017-03-28 ENCOUNTER — Ambulatory Visit (INDEPENDENT_AMBULATORY_CARE_PROVIDER_SITE_OTHER): Payer: Medicare Other

## 2017-03-28 ENCOUNTER — Encounter: Payer: Self-pay | Admitting: Sports Medicine

## 2017-03-28 ENCOUNTER — Ambulatory Visit (INDEPENDENT_AMBULATORY_CARE_PROVIDER_SITE_OTHER): Payer: Medicare Other | Admitting: Sports Medicine

## 2017-03-28 DIAGNOSIS — R52 Pain, unspecified: Secondary | ICD-10-CM

## 2017-03-28 DIAGNOSIS — M722 Plantar fascial fibromatosis: Secondary | ICD-10-CM

## 2017-03-28 DIAGNOSIS — I739 Peripheral vascular disease, unspecified: Secondary | ICD-10-CM

## 2017-03-28 DIAGNOSIS — Q828 Other specified congenital malformations of skin: Secondary | ICD-10-CM

## 2017-03-28 DIAGNOSIS — M7661 Achilles tendinitis, right leg: Secondary | ICD-10-CM | POA: Diagnosis not present

## 2017-03-28 NOTE — Progress Notes (Signed)
Subjective: Kathryn Steele is a 76 y.o. female patient presents to office with complaint of heel pain on the left and painful callus at bottom of left heel. Patient admits to post static dyskinesia for 6 months. Patient has treated this problem with injection, pad, insoles from [facility] [employer] [facility] with no relief. Denies any other pedal complaints.   Patient Active Problem List   Diagnosis Date Noted  . Atypical ductal hyperplasia of left breast 12/01/2013    Current Outpatient Prescriptions on File Prior to Visit  Medication Sig Dispense Refill  . ACIPHEX 20 MG tablet Take 20 mg by mouth daily. Does not take as directed    . albuterol (PROAIR HFA) 108 (90 Base) MCG/ACT inhaler Inhale into the lungs.    . ALBUTEROL IN Inhale into the lungs every morning.    Marland Kitchen amLODipine (NORVASC) 5 MG tablet Take 5 mg by mouth daily.     Marland Kitchen aspirin EC 81 MG tablet Take 81 mg by mouth daily.    Marland Kitchen BREO ELLIPTA 100-25 MCG/INH AEPB     . enalapril (VASOTEC) 10 MG tablet 2 (two) times daily.     Marland Kitchen exemestane (AROMASIN) 25 MG tablet TAKE 1 TABLET BY MOUTH EVERY MORNING 90 tablet 0  . flecainide (TAMBOCOR) 50 MG tablet     . furosemide (LASIX) 40 MG tablet Take 40 mg by mouth 2 (two) times daily.     Marland Kitchen HYDROcodone-acetaminophen (LORTAB) 7.5-500 MG per tablet Take 1 tablet by mouth every 6 (six) hours as needed.    Marland Kitchen LIPITOR 20 MG tablet Take 20 mg by mouth daily.     . metoprolol (LOPRESSOR) 50 MG tablet 2 (two) times daily. Pt takes she takes 1/2 bid    . nitroGLYCERIN (NITRODUR - DOSED IN MG/24 HR) 0.4 mg/hr Place 1 patch onto the skin daily.     . Potassium Bicarb-Citric Acid 10 MEQ TBEF Take by mouth 2 (two) times daily. 3 teaspoons bid    . potassium chloride SA (K-DUR,KLOR-CON) 20 MEQ tablet     . Vitamin D, Ergocalciferol, (DRISDOL) 50000 units CAPS capsule TAKE 1 CAPSULE BY MOUTH ONCE WEEKLY (EVERY 7 DAYS). 12 capsule 4  . XARELTO 20 MG TABS Take 20 mg by mouth daily.      No current  facility-administered medications on file prior to visit.     Allergies  Allergen Reactions  . Xanax [Alprazolam]     palpitations  . Other     Pt states she is allergic to some drug but can't remember- she will call us with info.  . Iodinated Diagnostic Agents     Skin burns  . Sulfa Antibiotics     Pain in ears    Objective: Physical Exam General: The patient is alert and oriented x3 in no acute distress.  Dermatology: Skin is warm, dry and supple bilateral lower extremities. + keratotic lesion plantar heel on left, Nails 1-10 are within normal limits, There is no erythema, edema, no eccymosis, no open lesions present. Integument is otherwise unremarkable.  Vascular: Dorsalis Pedis pulse and Posterior Tibial pulse are 1/4 bilateral. Capillary fill time is <5 secs to all digits. Venous hyperpigmentation bilateral with history of ulceration at right leg.   Neurological: Grossly intact to light touch with an achilles reflex of +1/5 and a negative Tinel's sign bilateral.  Musculoskeletal: Tenderness to palpation at the medial calcaneal tubercale and through the insertion of the plantar fascia and achilles on the left foot and at keratotic lesion  on left heel. No pain with compression of calcaneus bilateral. No pain with tuning fork to calcaneus bilateral. No pain with calf compression bilateral. There is decreased Ankle joint range of motion and pedal range of motion. Strength 4/5 in all groups bilateral.   Gait: Unassisted, Antalgic avoid weight on left heel  Xray, Left foot:  Normal osseous mineralization. Joint spaces narrowed consistent with arthrex. No fracture/dislocation/boney destruction. Calcaneal spur present with mild thickening of plantar fascia. No other soft tissue abnormalities or radiopaque foreign bodies.   Assessment and Plan: Problem List Items Addressed This Visit    None    Visit Diagnoses    Plantar fasciitis of left foot    -  Primary   Pain       Relevant  Orders   DG Foot Complete Left   Tendonitis, Achilles, right       Porokeratosis       PVD (peripheral vascular disease) (HCC)          -Complete examination performed.  -Xrays reviewed -Discussed with patient in detail the condition of plantar fasciitis and achilles tendonitis, how this occurs and general treatment options. Explained both conservative and surgical treatments.  -After oral consent and aseptic prep, injected a mixture containing 1 ml of 2% plain lidocaine, 1 ml 0.5% plain marcaine, 0.5 ml of kenalog 10 and 0.5 ml of dexamethasone phosphate into left heel. Post-injection care discussed with patient.  -Recommended good supportive shoes and advised use of OTC insert/padding -Explained and dispensed to patient daily stretching exercises. -Recommend patient to ice affected area 1-2x daily. -Mechanically debrided left heel keratosis using sterile chisel blade and applied salinocaine and offloading padding.  -Patient to return to office in 4 weeks for follow up or sooner if problems or questions arise.  Landis Martins, DPM

## 2017-03-28 NOTE — Progress Notes (Signed)
   Subjective:    Patient ID: Kathryn Steele, female    DOB: 11/18/1941, 76 y.o.   MRN: [id number]  HPI   I think I have a bone spur on my left foot and has been going on for about a year and does hurt     Review of Systems  All other systems reviewed and are negative.      Objective:   Physical Exam        Assessment & Plan:

## 2017-04-16 ENCOUNTER — Other Ambulatory Visit: Payer: Self-pay | Admitting: *Deleted

## 2017-04-16 DIAGNOSIS — E559 Vitamin D deficiency, unspecified: Secondary | ICD-10-CM

## 2017-04-16 DIAGNOSIS — N6092 Unspecified benign mammary dysplasia of left breast: Secondary | ICD-10-CM

## 2017-04-17 ENCOUNTER — Ambulatory Visit (HOSPITAL_BASED_OUTPATIENT_CLINIC_OR_DEPARTMENT_OTHER): Payer: Medicare Other | Admitting: Hematology & Oncology

## 2017-04-17 ENCOUNTER — Other Ambulatory Visit (HOSPITAL_BASED_OUTPATIENT_CLINIC_OR_DEPARTMENT_OTHER): Payer: Medicare Other

## 2017-04-17 VITALS — BP 122/64 | HR 50 | Temp 98.3°F | Resp 16 | Wt 233.2 lb

## 2017-04-17 DIAGNOSIS — I482 Chronic atrial fibrillation: Secondary | ICD-10-CM

## 2017-04-17 DIAGNOSIS — E559 Vitamin D deficiency, unspecified: Secondary | ICD-10-CM

## 2017-04-17 DIAGNOSIS — Z79811 Long term (current) use of aromatase inhibitors: Secondary | ICD-10-CM

## 2017-04-17 DIAGNOSIS — N6092 Unspecified benign mammary dysplasia of left breast: Secondary | ICD-10-CM | POA: Diagnosis present

## 2017-04-17 DIAGNOSIS — M818 Other osteoporosis without current pathological fracture: Secondary | ICD-10-CM | POA: Insufficient documentation

## 2017-04-17 DIAGNOSIS — Z7901 Long term (current) use of anticoagulants: Secondary | ICD-10-CM

## 2017-04-17 LAB — CBC WITH DIFFERENTIAL (CANCER CENTER ONLY)
BASO#: 0 10*3/uL (ref 0.0–0.2)
BASO%: 0.6 % (ref 0.0–2.0)
EOS%: 2.1 % (ref 0.0–7.0)
Eosinophils Absolute: 0.1 10*3/uL (ref 0.0–0.5)
HCT: 38.6 % (ref 34.8–46.6)
HGB: 12.1 g/dL (ref 11.6–15.9)
LYMPH#: 1.5 10*3/uL (ref 0.9–3.3)
LYMPH%: 28.8 % (ref 14.0–48.0)
MCH: 30.9 pg (ref 26.0–34.0)
MCHC: 31.3 g/dL — ABNORMAL LOW (ref 32.0–36.0)
MCV: 99 fL (ref 81–101)
MONO#: 0.7 10*3/uL (ref 0.1–0.9)
MONO%: 13.5 % — ABNORMAL HIGH (ref 0.0–13.0)
NEUT#: 2.9 10*3/uL (ref 1.5–6.5)
NEUT%: 55 % (ref 39.6–80.0)
PLATELETS: 167 10*3/uL (ref 145–400)
RBC: 3.91 10*6/uL (ref 3.70–5.32)
RDW: 13.2 % (ref 11.1–15.7)
WBC: 5.3 10*3/uL (ref 3.9–10.0)

## 2017-04-17 LAB — COMPREHENSIVE METABOLIC PANEL
ALT: 11 U/L (ref 0–55)
AST: 17 U/L (ref 5–34)
Albumin: 3.8 g/dL (ref 3.5–5.0)
Alkaline Phosphatase: 110 U/L (ref 40–150)
Anion Gap: 8 mEq/L (ref 3–11)
BILIRUBIN TOTAL: 0.59 mg/dL (ref 0.20–1.20)
BUN: 13.9 mg/dL (ref 7.0–26.0)
CO2: 28 meq/L (ref 22–29)
Calcium: 10.7 mg/dL — ABNORMAL HIGH (ref 8.4–10.4)
Chloride: 106 mEq/L (ref 98–109)
Creatinine: 1 mg/dL (ref 0.6–1.1)
EGFR: 57 mL/min/{1.73_m2} — ABNORMAL LOW (ref >90)
GLUCOSE: 97 mg/dL (ref 70–140)
Potassium: 4.5 mEq/L (ref 3.5–5.1)
SODIUM: 142 meq/L (ref 136–145)
TOTAL PROTEIN: 7.1 g/dL (ref 6.4–8.3)

## 2017-04-17 NOTE — Progress Notes (Signed)
Hematology and Oncology Follow Up Visit  Kathryn Steele [id number] 24-Sep-1941 76 y.o. 04/17/2017   Principle Diagnosis:   Atypical ductal hyperplasia of the left breast  Chronic atrial fibrillation  Current Therapy:    Aromasin 25 mg by mouth daily  Xarelto 20 mg by mouth daily     Interim History:  [name] is back for follow-up. It is been a little bit over a year since we last saw her. She's been doing okay. She and her husband have their set of health issues. He has recurrent mantle cell lymphoma. He is on [place].  She's done well with Xarelto. She has a atrial fibrillation.  She is trying to lose a little weight. It is hard for her to lose weight.  She had knee surgery about 7 or 8 years ago. She had both knees replaced. She does have a ganglion cyst on the dorsum of the left hand. This is between the thumb and index finger. I told her that she can go see her orthopedist for this.  She had a mammogram recently. I still do not have the report. She says that it was reported as okay.   She's not noted any rashes.   Overall, her performance status is ECOG 2. Medications:  Current Outpatient Prescriptions:  .  ACIPHEX 20 MG tablet, Take 20 mg by mouth daily. Does not take as directed, Disp: , Rfl:  .  albuterol (PROAIR HFA) 108 (90 Base) MCG/ACT inhaler, Inhale into the lungs., Disp: , Rfl:  .  ALBUTEROL IN, Inhale into the lungs every morning., Disp: , Rfl:  .  amLODipine (NORVASC) 5 MG tablet, Take 5 mg by mouth daily. , Disp: , Rfl:  .  aspirin EC 81 MG tablet, Take 81 mg by mouth daily., Disp: , Rfl:  .  BREO ELLIPTA 100-25 MCG/INH AEPB, , Disp: , Rfl:  .  doxycycline (VIBRAMYCIN) 100 MG capsule, , Disp: , Rfl:  .  enalapril (VASOTEC) 10 MG tablet, 2 (two) times daily. , Disp: , Rfl:  .  exemestane (AROMASIN) 25 MG tablet, TAKE 1 TABLET BY MOUTH EVERY MORNING, Disp: 90 tablet, Rfl: 0 .  flecainide (TAMBOCOR) 50 MG tablet, , Disp: , Rfl:  .  furosemide  (LASIX) 40 MG tablet, Take 40 mg by mouth 2 (two) times daily. , Disp: , Rfl:  .  HYDROcodone-acetaminophen (LORTAB) 7.5-500 MG per tablet, Take 1 tablet by mouth every 6 (six) hours as needed., Disp: , Rfl:  .  HYDROcodone-acetaminophen (NORCO) 7.5-325 MG tablet, for pain, Disp: , Rfl: 0 .  LIPITOR 20 MG tablet, Take 20 mg by mouth daily. , Disp: , Rfl:  .  metoprolol (LOPRESSOR) 50 MG tablet, 2 (two) times daily. Pt takes she takes 1/2 bid, Disp: , Rfl:  .  nitroGLYCERIN (NITRODUR - DOSED IN MG/24 HR) 0.4 mg/hr, Place 1 patch onto the skin daily. , Disp: , Rfl:  .  Potassium Bicarb-Citric Acid 10 MEQ TBEF, Take by mouth 2 (two) times daily. 3 teaspoons bid, Disp: , Rfl:  .  potassium chloride SA (K-DUR,KLOR-CON) 20 MEQ tablet, , Disp: , Rfl:  .  Vitamin D, Ergocalciferol, (DRISDOL) 50000 units CAPS capsule, TAKE 1 CAPSULE BY MOUTH ONCE WEEKLY (EVERY 7 DAYS)., Disp: 12 capsule, Rfl: 4 .  XARELTO 20 MG TABS, Take 20 mg by mouth daily. , Disp: , Rfl:   Allergies:  Allergies  Allergen Reactions  . Xanax [Alprazolam]     palpitations  . Other  Pt states she is allergic to some drug but can't remember- she will call us with info.  . Iodinated Diagnostic Agents     Skin burns  . Sulfa Antibiotics     Pain in ears    Past Medical History, Surgical history, Social history, and Family History were reviewed and updated.  Review of Systems: As above  Physical Exam:  weight is 233 lb 4 oz (105.8 kg). Her oral temperature is 98.3 F (36.8 C). Her blood pressure is 122/64 and her pulse is 50 (abnormal). Her respiration is 16 and oxygen saturation is 99%.   Obese white female in no obvious distress. Head and neck exam shows no ocular or oral lesions. She has no palpable cervical or supraclavicular lymph nodes. Lungs are clear cardiac exam irregular rate and rhythm consistent with atrial fibrillation. There are no murmurs, rubs or bruits. Abdomen is soft. She is obese. She has good bowel sounds.  There is no fluid wave. There is no palpable liver or spleen tip. Breast exam shows right breast with no masses, edema or erythema. There is no right axillary adenopathy. Left breast shows well-healed lumpectomy at the 4:00 position. There is some slight firmness of the lumpectomy site. No distinct masses noted in the left breast. There is no left axillary adenopathy. Back exam shows no tenderness over the spine, ribs or hips. Extremities shows no clubbing, cyanosis or edema. She has compression stockings on her legs. She has surgical scars on both knees. She has good range of motion of her joints. Skin exam shows no rashes, ecchymoses or petechia. Neurological exam shows no focal neurological deficit.  Lab Results  Component Value Date   WBC 5.3 04/17/2017   HGB 12.1 04/17/2017   HCT 38.6 04/17/2017   MCV 99 04/17/2017   PLT 167 04/17/2017     Chemistry      Component Value Date/Time   NA 143 02/14/2016 0910   K 3.7 02/14/2016 0910   CL 102 01/04/2015 0824   CO2 29 02/14/2016 0910   BUN 16.9 02/14/2016 0910   CREATININE 1.0 02/14/2016 0910      Component Value Date/Time   CALCIUM 10.1 02/14/2016 0910   ALKPHOS 93 02/14/2016 0910   AST 17 02/14/2016 0910   ALT 12 02/14/2016 0910   BILITOT 0.92 02/14/2016 0910         Impression and Plan: Kathryn Steele is 76 year old white female. She has atypical ductal hyperplasia left breast. We have been following this now for over 6 years. There's been no evidence of progression to malignancy.  We will continue her on the Aromasin. She's done very well with this.  I will plan to see her back in one year.   Volanda Napoleon, MD 5/31/201810:45 AM

## 2017-04-18 DIAGNOSIS — R319 Hematuria, unspecified: Secondary | ICD-10-CM | POA: Insufficient documentation

## 2017-04-18 DIAGNOSIS — K429 Umbilical hernia without obstruction or gangrene: Secondary | ICD-10-CM | POA: Insufficient documentation

## 2017-04-18 LAB — VITAMIN D 25 HYDROXY (VIT D DEFICIENCY, FRACTURES): VIT D 25 HYDROXY: 44.9 ng/mL (ref 30.0–100.0)

## 2017-04-25 ENCOUNTER — Ambulatory Visit: Payer: Medicare Other | Admitting: Sports Medicine

## 2017-05-01 ENCOUNTER — Ambulatory Visit (INDEPENDENT_AMBULATORY_CARE_PROVIDER_SITE_OTHER): Payer: Medicare Other | Admitting: Sports Medicine

## 2017-05-01 DIAGNOSIS — M722 Plantar fascial fibromatosis: Secondary | ICD-10-CM | POA: Diagnosis not present

## 2017-05-01 DIAGNOSIS — M79672 Pain in left foot: Secondary | ICD-10-CM

## 2017-05-01 DIAGNOSIS — Q828 Other specified congenital malformations of skin: Secondary | ICD-10-CM

## 2017-05-01 DIAGNOSIS — I739 Peripheral vascular disease, unspecified: Secondary | ICD-10-CM | POA: Diagnosis not present

## 2017-05-01 NOTE — Progress Notes (Signed)
Subjective: Kathryn Steele is a 76 y.o. female patient returns to office with complaint of heel pain on the left and painful callus at bottom of left heel. Patient reports that her heel feels better after the injection, however, has more buildup of the hard callus and wants injection and for the area to be trimmed again. Denies any other pedal complaints.   Patient Active Problem List   Diagnosis Date Noted  . Other osteoporosis without current pathological fracture 04/17/2017  . Atypical ductal hyperplasia of left breast 12/01/2013    Current Outpatient Prescriptions on File Prior to Visit  Medication Sig Dispense Refill  . ACIPHEX 20 MG tablet Take 20 mg by mouth daily. Does not take as directed    . albuterol (PROAIR HFA) 108 (90 Base) MCG/ACT inhaler Inhale into the lungs.    . ALBUTEROL IN Inhale into the lungs every morning.    Marland Kitchen amLODipine (NORVASC) 5 MG tablet Take 5 mg by mouth daily.     Marland Kitchen aspirin EC 81 MG tablet Take 81 mg by mouth daily.    Marland Kitchen BREO ELLIPTA 100-25 MCG/INH AEPB     . doxycycline (VIBRAMYCIN) 100 MG capsule     . enalapril (VASOTEC) 10 MG tablet 2 (two) times daily.     Marland Kitchen exemestane (AROMASIN) 25 MG tablet TAKE 1 TABLET BY MOUTH EVERY MORNING 90 tablet 0  . flecainide (TAMBOCOR) 50 MG tablet     . furosemide (LASIX) 40 MG tablet Take 40 mg by mouth 2 (two) times daily.     Marland Kitchen HYDROcodone-acetaminophen (LORTAB) 7.5-500 MG per tablet Take 1 tablet by mouth every 6 (six) hours as needed.    Marland Kitchen HYDROcodone-acetaminophen (NORCO) 7.5-325 MG tablet for pain  0  . LIPITOR 20 MG tablet Take 20 mg by mouth daily.     . metoprolol (LOPRESSOR) 50 MG tablet 2 (two) times daily. Pt takes she takes 1/2 bid    . nitroGLYCERIN (NITRODUR - DOSED IN MG/24 HR) 0.4 mg/hr Place 1 patch onto the skin daily.     . Potassium Bicarb-Citric Acid 10 MEQ TBEF Take by mouth 2 (two) times daily. 3 teaspoons bid    . potassium chloride SA (K-DUR,KLOR-CON) 20 MEQ tablet     . Vitamin D,  Ergocalciferol, (DRISDOL) 50000 units CAPS capsule TAKE 1 CAPSULE BY MOUTH ONCE WEEKLY (EVERY 7 DAYS). 12 capsule 4  . XARELTO 20 MG TABS Take 20 mg by mouth daily.      No current facility-administered medications on file prior to visit.     Allergies  Allergen Reactions  . Xanax [Alprazolam]     palpitations  . Other     Pt states she is allergic to some drug but can't remember- she will call us with info.  . Iodinated Diagnostic Agents     Skin burns  . Sulfa Antibiotics     Pain in ears    Objective: Physical Exam General: The patient is alert and oriented x3 in no acute distress.  Dermatology: Skin is warm, dry and supple bilateral lower extremities. + keratotic lesion plantar heel on left, Nails 1-10 are within normal limits, There is no erythema, no eccymosis, no open lesions present. Integument is otherwise unremarkable.  Vascular: Dorsalis Pedis pulse and Posterior Tibial pulse are 1/4 bilateral. Capillary fill time is <5 secs to all digits. Venous hyperpigmentation bilateral with history of ulceration at right leg.   Neurological: Grossly intact to light touch with an achilles reflex of +1/5 and  a negative Tinel's sign bilateral.  Musculoskeletal: Tenderness to palpation at the medial and central tubercale and through the insertion of the plantar fascia on the left and no pain at the achilles on the left foot. There is pain at the keratotic lesion on left heel. No pain with compression of calcaneus bilateral. No pain with tuning fork to calcaneus bilateral. No pain with calf compression bilateral. There is decreased Ankle joint range of motion and pedal range of motion. Strength 4/5 in all groups bilateral.   Assessment and Plan: Problem List Items Addressed This Visit    None    Visit Diagnoses    Porokeratosis    -  Primary   Plantar fasciitis of left foot       Pain of left heel          -Complete examination performed.  -Re-Discussed with patient in detail the  condition of plantar fasciitis and achilles tendonitis, how this occurs and general treatment options. Explained both conservative and surgical treatments.  -After oral consent and aseptic prep, injected a mixture containing 1 ml of 2% plain lidocaine, 1 ml 0.5% plain marcaine, 0.5 ml of kenalog 10 and 0.5 ml of dexamethasone phosphate into left heel. Post-injection care discussed with patient.  -Recommended continue with good supportive shoes and advised use of OTC insert/padding -Explained and dispensed to patient daily stretching exercises. -Recommend patient to ice affected area 1-2x daily. -Mechanically debrided left heel keratosis using sterile chisel blade and applied salinocaine and offloading padding.  -Advised patient to use softening creams pumice stones to help keep callused area minimal -Patient to return to office as needed or sooner if problems or questions arise.  Landis Martins, DPM

## 2017-06-06 ENCOUNTER — Other Ambulatory Visit: Payer: Self-pay | Admitting: Hematology & Oncology

## 2017-10-02 DIAGNOSIS — E538 Deficiency of other specified B group vitamins: Secondary | ICD-10-CM | POA: Insufficient documentation

## 2017-11-13 ENCOUNTER — Other Ambulatory Visit: Payer: Self-pay | Admitting: Hematology & Oncology

## 2018-01-07 DIAGNOSIS — J984 Other disorders of lung: Secondary | ICD-10-CM | POA: Insufficient documentation

## 2018-01-30 ENCOUNTER — Other Ambulatory Visit: Payer: Self-pay | Admitting: Hematology & Oncology

## 2018-04-10 DIAGNOSIS — E210 Primary hyperparathyroidism: Secondary | ICD-10-CM | POA: Insufficient documentation

## 2018-04-17 ENCOUNTER — Inpatient Hospital Stay: Payer: Medicare Other | Attending: Hematology & Oncology

## 2018-04-17 ENCOUNTER — Inpatient Hospital Stay (HOSPITAL_BASED_OUTPATIENT_CLINIC_OR_DEPARTMENT_OTHER): Payer: Medicare Other | Admitting: Hematology & Oncology

## 2018-04-17 ENCOUNTER — Other Ambulatory Visit: Payer: Self-pay

## 2018-04-17 VITALS — BP 138/61 | HR 79 | Temp 98.6°F | Resp 18 | Wt 226.0 lb

## 2018-04-17 DIAGNOSIS — N6092 Unspecified benign mammary dysplasia of left breast: Secondary | ICD-10-CM | POA: Diagnosis present

## 2018-04-17 DIAGNOSIS — Z7901 Long term (current) use of anticoagulants: Secondary | ICD-10-CM | POA: Diagnosis not present

## 2018-04-17 DIAGNOSIS — H3530 Unspecified macular degeneration: Secondary | ICD-10-CM | POA: Insufficient documentation

## 2018-04-17 DIAGNOSIS — M818 Other osteoporosis without current pathological fracture: Secondary | ICD-10-CM

## 2018-04-17 DIAGNOSIS — I482 Chronic atrial fibrillation: Secondary | ICD-10-CM

## 2018-04-17 LAB — CMP (CANCER CENTER ONLY)
ALT: 11 U/L (ref 0–55)
ANION GAP: 10 (ref 3–11)
AST: 20 U/L (ref 5–34)
Albumin: 4 g/dL (ref 3.5–5.0)
Alkaline Phosphatase: 104 U/L (ref 40–150)
BUN: 20 mg/dL (ref 7–26)
CALCIUM: 11 mg/dL — AB (ref 8.4–10.4)
CO2: 27 mmol/L (ref 22–29)
CREATININE: 0.97 mg/dL (ref 0.60–1.10)
Chloride: 106 mmol/L (ref 98–109)
GFR, EST NON AFRICAN AMERICAN: 55 mL/min — AB (ref >60)
Glucose, Bld: 93 mg/dL (ref 70–140)
Potassium: 4 mmol/L (ref 3.5–5.1)
SODIUM: 143 mmol/L (ref 136–145)
TOTAL PROTEIN: 7.4 g/dL (ref 6.4–8.3)
Total Bilirubin: 0.8 mg/dL (ref 0.2–1.2)

## 2018-04-17 LAB — CBC WITH DIFFERENTIAL (CANCER CENTER ONLY)
Basophils Absolute: 0 10*3/uL (ref 0.0–0.1)
Basophils Relative: 1 %
EOS ABS: 0.2 10*3/uL (ref 0.0–0.5)
EOS PCT: 3 %
HCT: 39.8 % (ref 34.8–46.6)
HEMOGLOBIN: 12.7 g/dL (ref 11.6–15.9)
LYMPHS ABS: 1.5 10*3/uL (ref 0.9–3.3)
LYMPHS PCT: 28 %
MCH: 30.5 pg (ref 26.0–34.0)
MCHC: 31.9 g/dL — AB (ref 32.0–36.0)
MCV: 95.7 fL (ref 81.0–101.0)
MONOS PCT: 14 %
Monocytes Absolute: 0.8 10*3/uL (ref 0.1–0.9)
Neutro Abs: 3.1 10*3/uL (ref 1.5–6.5)
Neutrophils Relative %: 54 %
Platelet Count: 174 10*3/uL (ref 145–400)
RBC: 4.16 MIL/uL (ref 3.70–5.32)
RDW: 13.7 % (ref 11.1–15.7)
WBC Count: 5.6 10*3/uL (ref 3.9–10.0)

## 2018-04-17 NOTE — Progress Notes (Signed)
Hematology and Oncology Follow Up Visit  Kathryn Steele [id number] 09/08/1941 77 y.o. 04/17/2018   Principle Diagnosis:   Atypical ductal hyperplasia of the left breast  Chronic atrial fibrillation  Current Therapy:    Aromasin 25 mg by mouth daily -- d/c on 04/17/2018  Xarelto 20 mg by mouth daily     Interim History:  [name] is back for follow-up. It is been a little bit over a year since we last saw her.  Avastin injections into the left eye.  This is being done at [facility].  Her brother apparently has small cell lung cancer.  He has been treated at the main cancer center in [place].  She is managing all of her issues.  She has had no flareups of the atrial fibrillation.  I think that we can probably stop her Aromasin now.  She is been on it for 7 years.  I am not sure we are getting that much more benefit from it.  She has had no problems with cough or shortness of breath.  She does have chronic leg edema.  She wears compression stockings.   Medications:  Current Outpatient Medications:  .  ACIPHEX 20 MG tablet, Take 20 mg by mouth daily. Does not take as directed, Disp: , Rfl:  .  albuterol (PROAIR HFA) 108 (90 Base) MCG/ACT inhaler, Inhale into the lungs., Disp: , Rfl:  .  ALBUTEROL IN, Inhale into the lungs every morning., Disp: , Rfl:  .  amLODipine (NORVASC) 5 MG tablet, Take 5 mg by mouth daily. , Disp: , Rfl:  .  aspirin EC 81 MG tablet, Take 81 mg by mouth daily., Disp: , Rfl:  .  BREO ELLIPTA 100-25 MCG/INH AEPB, , Disp: , Rfl:  .  doxycycline (VIBRAMYCIN) 100 MG capsule, , Disp: , Rfl:  .  enalapril (VASOTEC) 10 MG tablet, 2 (two) times daily. , Disp: , Rfl:  .  exemestane (AROMASIN) 25 MG tablet, TAKE 1 TABLET BY MOUTH EVERY MORNING, Disp: 90 tablet, Rfl: 0 .  flecainide (TAMBOCOR) 50 MG tablet, , Disp: , Rfl:  .  furosemide (LASIX) 40 MG tablet, Take 40 mg by mouth 2 (two) times daily. , Disp: , Rfl:  .  HYDROcodone-acetaminophen (LORTAB)  7.5-500 MG per tablet, Take 1 tablet by mouth every 6 (six) hours as needed., Disp: , Rfl:  .  HYDROcodone-acetaminophen (NORCO) 7.5-325 MG tablet, for pain, Disp: , Rfl: 0 .  LIPITOR 20 MG tablet, Take 20 mg by mouth daily. , Disp: , Rfl:  .  metoprolol (LOPRESSOR) 50 MG tablet, 2 (two) times daily. Pt takes she takes 1/2 bid, Disp: , Rfl:  .  nitroGLYCERIN (NITRODUR - DOSED IN MG/24 HR) 0.4 mg/hr, Place 1 patch onto the skin daily. , Disp: , Rfl:  .  Potassium Bicarb-Citric Acid 10 MEQ TBEF, Take by mouth 2 (two) times daily. 3 teaspoons bid, Disp: , Rfl:  .  potassium chloride SA (K-DUR,KLOR-CON) 20 MEQ tablet, , Disp: , Rfl:  .  Vitamin D, Ergocalciferol, (DRISDOL) 50000 units CAPS capsule, TAKE 1 CAPSULE BY MOUTH ONCE WEEKLY, Disp: 12 capsule, Rfl: 4 .  XARELTO 20 MG TABS, Take 20 mg by mouth daily. , Disp: , Rfl:   Allergies:  Allergies  Allergen Reactions  . Alprazolam Palpitations    palpitations palpitations   . Pseudoephedrine Palpitations  . Sulfa Antibiotics Anaphylaxis    Pain in ears Pain in ears  . Nitrofurantoin Rash  . Sulfamethoxazole-Trimethoprim Tinitus  .  Amoxicillin Other (See Comments)    Pain in legs   . Ampicillin Other (See Comments)    Pain in legs   . Cephalexin Other (See Comments)    Stomach get real raw  . Iodine Hives  . Metrizamide Other (See Comments)    Skin burns  . Nitrofurantoin Macrocrystal   . Other     Pt states she is allergic to some drug but can't remember- she will call us with info.  . Sulfasalazine Other (See Comments)    Pain in ears  . Iodinated Diagnostic Agents Itching    Skin burns Skin burns   . Prednisone Palpitations    Past Medical History, Surgical history, Social history, and Family History were reviewed and updated.  Review of Systems: Review of Systems  Constitutional: Negative.   HENT: Negative.   Eyes: Positive for blurred vision.  Respiratory: Negative.   Cardiovascular: Positive for palpitations and  leg swelling.  Gastrointestinal: Negative.   Genitourinary: Negative.   Musculoskeletal: Positive for myalgias.  Skin: Negative.   Neurological: Negative.   Endo/Heme/Allergies: Negative.   Psychiatric/Behavioral: Negative.      Physical Exam:  weight is 226 lb (102.5 kg). Her oral temperature is 98.6 F (37 C). Her blood pressure is 138/61 and her pulse is 79. Her respiration is 18 and oxygen saturation is 97%.   Obese white female in no obvious distress. Head and neck exam shows no ocular or oral lesions. She has no palpable cervical or supraclavicular lymph nodes. Lungs are clear cardiac exam irregular rate and rhythm consistent with atrial fibrillation. There are no murmurs, rubs or bruits. Abdomen is soft. She is obese. She has good bowel sounds. There is no fluid wave. There is no palpable liver or spleen tip. Breast exam shows right breast with no masses, edema or erythema. There is no right axillary adenopathy. Left breast shows well-healed lumpectomy at the 4:00 position. There is some slight firmness of the lumpectomy site. No distinct masses noted in the left breast. There is no left axillary adenopathy. Back exam shows no tenderness over the spine, ribs or hips. Extremities shows no clubbing, cyanosis or edema. She has compression stockings on her legs. She has surgical scars on both knees. She has good range of motion of her joints. Skin exam shows no rashes, ecchymoses or petechia. Neurological exam shows no focal neurological deficit.  Lab Results  Component Value Date   WBC 5.6 04/17/2018   HGB 12.7 04/17/2018   HCT 39.8 04/17/2018   MCV 95.7 04/17/2018   PLT 174 04/17/2018     Chemistry      Component Value Date/Time   NA 142 04/17/2017 1010   K 4.5 04/17/2017 1010   CL 102 01/04/2015 0824   CO2 28 04/17/2017 1010   BUN 13.9 04/17/2017 1010   CREATININE 1.0 04/17/2017 1010      Component Value Date/Time   CALCIUM 10.7 (H) 04/17/2017 1010   ALKPHOS 110 04/17/2017  1010   AST 17 04/17/2017 1010   ALT 11 04/17/2017 1010   BILITOT 0.59 04/17/2017 1010         Impression and Plan: Kathryn Steele is 77 year old white female. She has atypical ductal hyperplasia left breast. We have been following this now for over 7 years. There's been no evidence of progression to malignancy.  At this point, I think we probably stop the Aromasin.  She is been on now for 7 years.  I just do not think that we are  going to get much more benefit out of it.  We will see her back in another year.  I hope that the macular degeneration does not cause problems for her.     Volanda Napoleon, MD 5/31/201911:12 AM

## 2018-04-18 LAB — VITAMIN D 25 HYDROXY (VIT D DEFICIENCY, FRACTURES): VIT D 25 HYDROXY: 40.4 ng/mL (ref 30.0–100.0)

## 2018-12-09 DIAGNOSIS — H3562 Retinal hemorrhage, left eye: Secondary | ICD-10-CM | POA: Insufficient documentation

## 2019-04-16 ENCOUNTER — Other Ambulatory Visit: Payer: Medicare Other

## 2019-04-16 ENCOUNTER — Ambulatory Visit: Payer: Medicare Other | Admitting: Hematology & Oncology

## 2019-04-22 ENCOUNTER — Ambulatory Visit (INDEPENDENT_AMBULATORY_CARE_PROVIDER_SITE_OTHER): Payer: Medicare Other | Admitting: Sports Medicine

## 2019-04-22 ENCOUNTER — Encounter: Payer: Self-pay | Admitting: Sports Medicine

## 2019-04-22 ENCOUNTER — Other Ambulatory Visit: Payer: Self-pay

## 2019-04-22 VITALS — Temp 96.6°F

## 2019-04-22 DIAGNOSIS — Q828 Other specified congenital malformations of skin: Secondary | ICD-10-CM | POA: Diagnosis not present

## 2019-04-22 DIAGNOSIS — M79671 Pain in right foot: Secondary | ICD-10-CM

## 2019-04-22 NOTE — Progress Notes (Signed)
Subjective: Kathryn Steele is a 78 y.o. female patient who presents to office for evaluation of Right foot pain secondary to callus skin. Patient complains of pain at the lesion present Right foot at the bottom side. Patient has tried resting with a little relief in symptoms. Patient denies any other pedal complaints.   Patient on Xarleto for heart and Lasix for swelling.   Patient Active Problem List   Diagnosis Date Noted  . Subretinal hemorrhage of left eye 12/09/2018  . Primary hyperparathyroidism ([place]) 04/10/2018  . Restrictive lung disease 01/07/2018  . Vitamin B12 deficiency 10/02/2017  . Hematuria 04/18/2017  . Umbilical hernia without obstruction and without gangrene 04/18/2017  . Other osteoporosis without current pathological fracture 04/17/2017  . Advanced nonexudative age-related macular degeneration of right eye without subfoveal involvement 03/13/2017  . Combined forms of age-related cataract of right eye 03/13/2017  . PVD (posterior vitreous detachment), bilateral 03/13/2017  . Chronic sinusitis 08/30/2016  . Malaise and fatigue 08/30/2016  . Calcaneal spur of left foot 06/11/2016  . Plantar fat pad atrophy of left foot 06/11/2016  . Left calcaneal bursitis 05/16/2016  . Porokeratosis 05/16/2016  . Venous insufficiency of both lower extremities 05/16/2016  . Anticoagulant long-term use 02/28/2016  . CAD in native artery 02/28/2016  . CKD (chronic kidney disease) stage 3, GFR 30-59 ml/min (HCC) 02/28/2016  . Gastroesophageal reflux disease without esophagitis 02/28/2016  . High risk medication use 02/28/2016  . Localized edema 02/28/2016  . Malignant neoplasm of colon ([place]) 02/28/2016  . Mixed hyperlipidemia 02/28/2016  . Morbid obesity with BMI of 40.0-44.9, adult ([place]) 02/28/2016  . Primary osteoarthritis involving multiple joints 02/28/2016  . Vitamin D deficiency 02/28/2016  . Moderate persistent asthma without complication 62/37/6283  . Atypical ductal  hyperplasia of left breast 12/01/2013  . Diabetes mellitus ([place]) 11/23/2013  . Permanent atrial fibrillation 11/16/2013    Current Outpatient Medications on File Prior to Visit  Medication Sig Dispense Refill  . clindamycin (CLEOCIN) 300 MG capsule Take 300 mg by mouth 3 (three) times daily.    . Cyanocobalamin (VITAMIN B-12 PO) Take by mouth.    Marland Kitchen gentamicin ointment (GARAMYCIN) 0.1 % Apply 1 application topically 3 (three) times daily.    . Multiple Vitamins-Minerals (PRESERVISION AREDS 2 PO) Take by mouth.    . OMEPRAZOLE PO Take by mouth.    . rosuvastatin (CRESTOR) 10 MG tablet Take 10 mg by mouth daily.    Marland Kitchen SPIRONOLACTONE PO Take by mouth.    . triamcinolone cream (KENALOG) 0.1 % Apply 1 application topically 2 (two) times daily.    Marland Kitchen albuterol (PROAIR HFA) 108 (90 Base) MCG/ACT inhaler Inhale into the lungs.    Marland Kitchen BREO ELLIPTA 100-25 MCG/INH AEPB     . enalapril (VASOTEC) 10 MG tablet 2 (two) times daily.     . furosemide (LASIX) 40 MG tablet Take 40 mg by mouth 2 (two) times daily.     Marland Kitchen HYDROcodone-acetaminophen (NORCO) 7.5-325 MG tablet for pain  0  . metoprolol (LOPRESSOR) 50 MG tablet 2 (two) times daily. Pt takes she takes 1/2 bid    . nitroGLYCERIN (NITRODUR - DOSED IN MG/24 HR) 0.4 mg/hr Place 1 patch onto the skin daily.     . Vitamin D, Ergocalciferol, (DRISDOL) 50000 units CAPS capsule TAKE 1 CAPSULE BY MOUTH ONCE WEEKLY 12 capsule 4  . XARELTO 20 MG TABS Take 20 mg by mouth daily.      No current facility-administered medications on file prior to  visit.     Allergies  Allergen Reactions  . Alprazolam Palpitations    palpitations palpitations   . Pseudoephedrine Palpitations  . Sulfa Antibiotics Anaphylaxis    Pain in ears Pain in ears  . Nitrofurantoin Rash  . Sulfamethoxazole-Trimethoprim Tinitus  . Amoxicillin Other (See Comments)    Pain in legs   . Ampicillin Other (See Comments)    Pain in legs   . Cephalexin Other (See Comments)    Stomach get  real raw  . Iodine Hives  . Metrizamide Other (See Comments)    Skin burns  . Nitrofurantoin Macrocrystal   . Other     Pt states she is allergic to some drug but can't remember- she will call us with info.  . Sulfasalazine Other (See Comments)    Pain in ears  . Iodinated Diagnostic Agents Itching    Skin burns Skin burns   . Prednisone Palpitations    Objective:  General: Alert and oriented x3 in no acute distress  Dermatology: Keratotic lesion present at styloid process on right with skin lines transversing the lesion, pain is present with direct pressure to the lesion with a central nucleated core noted, no webspace macerations, no ecchymosis bilateral, all nails x 10 are short and thick.   Vascular: Dorsalis Pedis and Posterior Tibial pedal pulses 0/4, Capillary Fill Time 5 seconds, no pedal hair growth bilateral, +1 edema bilateral lower extremities, Temperature gradient decreased with hyperpigmentation bilateral consistent with PVD.   Neurology: [name] sensation intact via light touch bilateral.  Musculoskeletal: Mild tenderness with palpation at the keratotic lesion site on Right, Muscular strength 4/5 in all groups without pain or limitation on range of motion.  Assessment and Plan: Problem List Items Addressed This Visit      Musculoskeletal and Integument   Porokeratosis - Primary    Other Visit Diagnoses    Right foot pain          -Complete examination performed -Discussed treatment options -Parred keratoic lesion using a chisel blade x 1 right foot; treated the area withSalinocaine covered with moleskin -Encouraged daily skin emollients -Encouraged use of pumice stone -Advised good supportive shoes and inserts -Patient to return to office as needed or sooner if condition worsens.  Landis Martins, DPM

## 2019-04-28 ENCOUNTER — Other Ambulatory Visit: Payer: Self-pay | Admitting: Hematology & Oncology

## 2019-05-06 ENCOUNTER — Other Ambulatory Visit: Payer: Self-pay | Admitting: *Deleted

## 2019-05-06 DIAGNOSIS — R5381 Other malaise: Secondary | ICD-10-CM

## 2019-05-06 DIAGNOSIS — M818 Other osteoporosis without current pathological fracture: Secondary | ICD-10-CM

## 2019-05-06 DIAGNOSIS — N6092 Unspecified benign mammary dysplasia of left breast: Secondary | ICD-10-CM

## 2019-05-07 ENCOUNTER — Inpatient Hospital Stay (HOSPITAL_BASED_OUTPATIENT_CLINIC_OR_DEPARTMENT_OTHER): Payer: Medicare Other | Admitting: Hematology & Oncology

## 2019-05-07 ENCOUNTER — Other Ambulatory Visit: Payer: Self-pay

## 2019-05-07 ENCOUNTER — Encounter: Payer: Self-pay | Admitting: Hematology & Oncology

## 2019-05-07 ENCOUNTER — Inpatient Hospital Stay: Payer: Medicare Other | Attending: Hematology & Oncology

## 2019-05-07 VITALS — BP 132/75 | HR 70 | Temp 97.9°F | Resp 16 | Wt 217.0 lb

## 2019-05-07 DIAGNOSIS — I482 Chronic atrial fibrillation, unspecified: Secondary | ICD-10-CM | POA: Insufficient documentation

## 2019-05-07 DIAGNOSIS — H3530 Unspecified macular degeneration: Secondary | ICD-10-CM | POA: Insufficient documentation

## 2019-05-07 DIAGNOSIS — Z7901 Long term (current) use of anticoagulants: Secondary | ICD-10-CM

## 2019-05-07 DIAGNOSIS — R5381 Other malaise: Secondary | ICD-10-CM

## 2019-05-07 DIAGNOSIS — N6092 Unspecified benign mammary dysplasia of left breast: Secondary | ICD-10-CM

## 2019-05-07 DIAGNOSIS — M818 Other osteoporosis without current pathological fracture: Secondary | ICD-10-CM

## 2019-05-07 DIAGNOSIS — R5383 Other fatigue: Secondary | ICD-10-CM

## 2019-05-07 LAB — CBC WITH DIFFERENTIAL (CANCER CENTER ONLY)
Abs Immature Granulocytes: 0.02 10*3/uL (ref 0.00–0.07)
Basophils Absolute: 0 10*3/uL (ref 0.0–0.1)
Basophils Relative: 1 %
Eosinophils Absolute: 0.1 10*3/uL (ref 0.0–0.5)
Eosinophils Relative: 3 %
HCT: 38 % (ref 36.0–46.0)
Hemoglobin: 12 g/dL (ref 12.0–15.0)
Immature Granulocytes: 0 %
Lymphocytes Relative: 32 %
Lymphs Abs: 1.7 10*3/uL (ref 0.7–4.0)
MCH: 30.5 pg (ref 26.0–34.0)
MCHC: 31.6 g/dL (ref 30.0–36.0)
MCV: 96.7 fL (ref 80.0–100.0)
Monocytes Absolute: 0.6 10*3/uL (ref 0.1–1.0)
Monocytes Relative: 12 %
Neutro Abs: 2.7 10*3/uL (ref 1.7–7.7)
Neutrophils Relative %: 52 %
Platelet Count: 164 10*3/uL (ref 150–400)
RBC: 3.93 MIL/uL (ref 3.87–5.11)
RDW: 13.2 % (ref 11.5–15.5)
WBC Count: 5.3 10*3/uL (ref 4.0–10.5)
nRBC: 0 % (ref 0.0–0.2)

## 2019-05-07 LAB — CMP (CANCER CENTER ONLY)
ALT: 10 U/L (ref 0–44)
AST: 17 U/L (ref 15–41)
Albumin: 4.2 g/dL (ref 3.5–5.0)
Alkaline Phosphatase: 81 U/L (ref 38–126)
Anion gap: 8 (ref 5–15)
BUN: 21 mg/dL (ref 8–23)
CO2: 29 mmol/L (ref 22–32)
Calcium: 10.9 mg/dL — ABNORMAL HIGH (ref 8.9–10.3)
Chloride: 104 mmol/L (ref 98–111)
Creatinine: 0.89 mg/dL (ref 0.44–1.00)
GFR, Est AFR Am: >60 mL/min (ref >60)
GFR, Estimated: >60 mL/min (ref >60)
Glucose, Bld: 97 mg/dL (ref 70–99)
Potassium: 3.9 mmol/L (ref 3.5–5.1)
Sodium: 141 mmol/L (ref 135–145)
Total Bilirubin: 0.7 mg/dL (ref 0.3–1.2)
Total Protein: 6.8 g/dL (ref 6.5–8.1)

## 2019-05-07 MED ORDER — FLUTICASONE PROPIONATE 50 MCG/ACT NA SUSP: 2.0000 | Freq: Every day | NASAL | 2 refills | Status: DC

## 2019-05-07 NOTE — Progress Notes (Signed)
Hematology and Oncology Follow Up Visit  Kathryn Steele [id number] [birth year] 78 y.o. 05/07/2019   Principle Diagnosis:   Atypical ductal hyperplasia of the left breast  Chronic atrial fibrillation  Current Therapy:    Aromasin 25 mg by mouth daily -- d/c on 04/17/2018  Xarelto 20 mg by mouth daily     Interim History:  Ms.  Kathryn Steele is back for follow-up. It is been a little bit over a year since we last saw her.  The coronavirus has caused a lot of problems for her and her overall lifestyle.  She and her husband have not been able to travel.  She is still dealing with the atrial fibrillation.  She is on Xarelto.  Her biggest problem is a macular degeneration.  She is getting injections of Avastin into the left eye.  She is not sure this really is helping.  Her brother passed away from lung cancer back in February.  She is under a lot of stress right now.  The macular degeneration, her brother died, coronavirus all are factors in her stress.  She is due for mammogram in July.  This is been put on hold because of the coronavirus.  Her appetite is doing okay.  Weight is holding stable.  She has had no change in bowel bladder habits.  Overall, her performance status is ECOG 1.    Medications:  Current Outpatient Medications:  .  albuterol (PROAIR HFA) 108 (90 Base) MCG/ACT inhaler, Inhale into the lungs., Disp: , Rfl:  .  BREO ELLIPTA 100-25 MCG/INH AEPB, , Disp: , Rfl:  .  clindamycin (CLEOCIN) 300 MG capsule, Take 300 mg by mouth 3 (three) times daily., Disp: , Rfl:  .  Cyanocobalamin (VITAMIN B-12 PO), Take by mouth., Disp: , Rfl:  .  enalapril (VASOTEC) 10 MG tablet, 2 (two) times daily. , Disp: , Rfl:  .  furosemide (LASIX) 40 MG tablet, Take 40 mg by mouth 2 (two) times daily. , Disp: , Rfl:  .  gentamicin ointment (GARAMYCIN) 0.1 %, Apply 1 application topically 3 (three) times daily., Disp: , Rfl:  .  HYDROcodone-acetaminophen (NORCO) 7.5-325 MG tablet, for  pain, Disp: , Rfl: 0 .  metoprolol (LOPRESSOR) 50 MG tablet, 2 (two) times daily. Pt takes she takes 1/2 bid, Disp: , Rfl:  .  Multiple Vitamins-Minerals (PRESERVISION AREDS 2 PO), Take by mouth., Disp: , Rfl:  .  nitroGLYCERIN (NITRODUR - DOSED IN MG/24 HR) 0.4 mg/hr, Place 1 patch onto the skin daily. , Disp: , Rfl:  .  OMEPRAZOLE PO, Take by mouth., Disp: , Rfl:  .  rosuvastatin (CRESTOR) 10 MG tablet, Take 10 mg by mouth daily., Disp: , Rfl:  .  SPIRONOLACTONE PO, Take by mouth., Disp: , Rfl:  .  triamcinolone cream (KENALOG) 0.1 %, Apply 1 application topically 2 (two) times daily., Disp: , Rfl:  .  Vitamin D, Ergocalciferol, (DRISDOL) 1.25 MG (50000 UT) CAPS capsule, TAKE 1 CAPSULE BY MOUTH ONCE WEEKLY, Disp: 12 capsule, Rfl: 4 .  XARELTO 20 MG TABS, Take 20 mg by mouth daily. , Disp: , Rfl:   Allergies:  Allergies  Allergen Reactions  . Alprazolam Palpitations    palpitations palpitations   . Pseudoephedrine Palpitations  . Sulfa Antibiotics Anaphylaxis    Pain in ears Pain in ears  . Nitrofurantoin Rash  . Sulfamethoxazole-Trimethoprim Tinitus  . Amoxicillin Other (See Comments)    Pain in legs   . Ampicillin Other (See Comments)    Pain  in legs   . Cephalexin Other (See Comments)    Stomach get real raw  . Iodine Hives  . Metrizamide Other (See Comments)    Skin burns  . Nitrofurantoin Macrocrystal   . Other     Pt states she is allergic to some drug but can't remember- she will call us with info.  . Sulfasalazine Other (See Comments)    Pain in ears  . Iodinated Diagnostic Agents Itching    Skin burns Skin burns   . Prednisone Palpitations    Past Medical History, Surgical history, Social history, and Family History were reviewed and updated.  Review of Systems: Review of Systems  Constitutional: Negative.   HENT: Negative.   Eyes: Positive for blurred vision.  Respiratory: Negative.   Cardiovascular: Positive for palpitations and leg swelling.   Gastrointestinal: Negative.   Genitourinary: Negative.   Musculoskeletal: Positive for myalgias.  Skin: Negative.   Neurological: Negative.   Endo/Heme/Allergies: Negative.   Psychiatric/Behavioral: Negative.      Physical Exam:  vitals were not taken for this visit.   Physical Exam Vitals signs reviewed.  HENT:     Head: Normocephalic and atraumatic.  Eyes:     Pupils: Pupils are equal, round, and reactive to light.  Neck:     Musculoskeletal: Normal range of motion.  Cardiovascular:     Rate and Rhythm: Normal rate and regular rhythm.     Heart sounds: Normal heart sounds.  Pulmonary:     Effort: Pulmonary effort is normal.     Breath sounds: Normal breath sounds.  Abdominal:     General: Bowel sounds are normal.     Palpations: Abdomen is soft.  Musculoskeletal: Normal range of motion.        General: No tenderness or deformity.  Lymphadenopathy:     Cervical: No cervical adenopathy.  Skin:    General: Skin is warm and dry.     Findings: No erythema or rash.  Neurological:     Mental Status: She is alert and oriented to person, place, and time.  Psychiatric:        Behavior: Behavior normal.        Thought Content: Thought content normal.        Judgment: Judgment normal.      Lab Results  Component Value Date   WBC 5.3 05/07/2019   HGB 12.0 05/07/2019   HCT 38.0 05/07/2019   MCV 96.7 05/07/2019   PLT 164 05/07/2019     Chemistry      Component Value Date/Time   NA 143 04/17/2018 1030   NA 142 04/17/2017 1010   K 4.0 04/17/2018 1030   K 4.5 04/17/2017 1010   CL 106 04/17/2018 1030   CO2 27 04/17/2018 1030   CO2 28 04/17/2017 1010   BUN 20 04/17/2018 1030   BUN 13.9 04/17/2017 1010   CREATININE 0.97 04/17/2018 1030   CREATININE 1.0 04/17/2017 1010      Component Value Date/Time   CALCIUM 11.0 (H) 04/17/2018 1030   CALCIUM 10.7 (H) 04/17/2017 1010   ALKPHOS 104 04/17/2018 1030   ALKPHOS 110 04/17/2017 1010   AST 20 04/17/2018 1030   AST 17  04/17/2017 1010   ALT 11 04/17/2018 1030   ALT 11 04/17/2017 1010   BILITOT 0.8 04/17/2018 1030   BILITOT 0.59 04/17/2017 1010         Impression and Plan: [name] is 78 year old white female. She has atypical ductal hyperplasia left breast. We  have been following this now for over 7 years. There's been no evidence of progression to malignancy.  At this point, I back yearly.  She likes to see [place].  I feel bad that she has macular degeneration that seems to be worsening.  Hopefully, next year her husband will be able to come in with her.  Volanda Napoleon, MD 6/19/20208:06 AM

## 2019-05-08 LAB — VITAMIN D 25 HYDROXY (VIT D DEFICIENCY, FRACTURES): Vit D, 25-Hydroxy: 39 ng/mL (ref 30.0–100.0)

## 2020-01-21 ENCOUNTER — Other Ambulatory Visit: Payer: Self-pay

## 2020-01-21 ENCOUNTER — Ambulatory Visit (INDEPENDENT_AMBULATORY_CARE_PROVIDER_SITE_OTHER): Payer: Medicare Other | Admitting: Sports Medicine

## 2020-01-21 ENCOUNTER — Encounter: Payer: Self-pay | Admitting: Sports Medicine

## 2020-01-21 DIAGNOSIS — M79671 Pain in right foot: Secondary | ICD-10-CM

## 2020-01-21 DIAGNOSIS — Q828 Other specified congenital malformations of skin: Secondary | ICD-10-CM

## 2020-01-21 NOTE — Progress Notes (Signed)
Subjective: Kathryn Steele is a 79 y.o. female patient who returns to office for evaluation of Right foot pain secondary to callus skin. Patient complains of pain at the lesion present Right foot at the bottom side reports that he has built back up and got really sore again has been trying to use Vaseline to the area and has changed her shoes which helps a little bit especially when she wears shoes or sandals in the house. Patient denies any other pedal complaints.   Patient on Xarleto for heart and Lasix for swelling.  Reports that she wants to have her nails trimmed possibly at next visit.  Patient Active Problem List   Diagnosis Date Noted  . Subretinal hemorrhage of left eye 12/09/2018  . Primary hyperparathyroidism ([place]) 04/10/2018  . Restrictive lung disease 01/07/2018  . Vitamin B12 deficiency 10/02/2017  . Hematuria 04/18/2017  . Umbilical hernia without obstruction and without gangrene 04/18/2017  . Other osteoporosis without current pathological fracture 04/17/2017  . Advanced nonexudative age-related macular degeneration of right eye without subfoveal involvement 03/13/2017  . Combined forms of age-related cataract of right eye 03/13/2017  . PVD (posterior vitreous detachment), bilateral 03/13/2017  . Chronic sinusitis 08/30/2016  . Malaise and fatigue 08/30/2016  . Calcaneal spur of left foot 06/11/2016  . Plantar fat pad atrophy of left foot 06/11/2016  . Left calcaneal bursitis 05/16/2016  . Porokeratosis 05/16/2016  . Venous insufficiency of both lower extremities 05/16/2016  . Anticoagulant long-term use 02/28/2016  . CAD in native artery 02/28/2016  . CKD (chronic kidney disease) stage 3, GFR 30-59 ml/min 02/28/2016  . Gastroesophageal reflux disease without esophagitis 02/28/2016  . High risk medication use 02/28/2016  . Localized edema 02/28/2016  . Malignant neoplasm of colon ([place]) 02/28/2016  . Mixed hyperlipidemia 02/28/2016  . Morbid obesity with BMI of  40.0-44.9, adult ([place]) 02/28/2016  . Primary osteoarthritis involving multiple joints 02/28/2016  . Vitamin D deficiency 02/28/2016  . Moderate persistent asthma without complication [id number]  . Atypical ductal hyperplasia of left breast 12/01/2013  . Diabetes mellitus ([place]) 11/23/2013  . Permanent atrial fibrillation ([place]) 11/16/2013    Current Outpatient Medications on File Prior to Visit  Medication Sig Dispense Refill  . albuterol (PROAIR HFA) 108 (90 Base) MCG/ACT inhaler Inhale into the lungs.    Marland Kitchen BREO ELLIPTA 100-25 MCG/INH AEPB     . clindamycin (CLEOCIN) 300 MG capsule Take 300 mg by mouth 3 (three) times daily.    . Cyanocobalamin (VITAMIN B-12 PO) Take by mouth.    . enalapril (VASOTEC) 10 MG tablet 2 (two) times daily.     . fluticasone (FLONASE) 50 MCG/ACT nasal spray Place 2 sprays into both nostrils daily. 15.8 mL 2  . furosemide (LASIX) 40 MG tablet Take 40 mg by mouth 2 (two) times daily.     Marland Kitchen gentamicin ointment (GARAMYCIN) 0.1 % Apply 1 application topically 3 (three) times daily.    Marland Kitchen HYDROcodone-acetaminophen (NORCO) 7.5-325 MG tablet for pain  0  . metoprolol (LOPRESSOR) 50 MG tablet 2 (two) times daily. Pt takes she takes 1/2 bid    . Multiple Vitamins-Minerals (PRESERVISION AREDS 2 PO) Take by mouth.    . nitroGLYCERIN (NITRODUR - DOSED IN MG/24 HR) 0.4 mg/hr Place 1 patch onto the skin daily.     Marland Kitchen OMEPRAZOLE PO Take by mouth.    . rosuvastatin (CRESTOR) 10 MG tablet Take 10 mg by mouth daily.    Marland Kitchen SPIRONOLACTONE PO Take by mouth.    Marland Kitchen  triamcinolone cream (KENALOG) 0.1 % Apply 1 application topically 2 (two) times daily.    . Vitamin D, Ergocalciferol, (DRISDOL) 1.25 MG (50000 UT) CAPS capsule TAKE 1 CAPSULE BY MOUTH ONCE WEEKLY 12 capsule 4  . XARELTO 20 MG TABS Take 20 mg by mouth daily.      No current facility-administered medications on file prior to visit.    Allergies  Allergen Reactions  . Alprazolam Palpitations    palpitations palpitations    . Pseudoephedrine Palpitations  . Sulfa Antibiotics Anaphylaxis    Pain in ears Pain in ears  . Nitrofurantoin Rash  . Sulfamethoxazole-Trimethoprim Tinitus  . Amoxicillin Other (See Comments)    Pain in legs   . Ampicillin Other (See Comments)    Pain in legs   . Cephalexin Other (See Comments)    Stomach get real raw  . Iodine Hives  . Metrizamide Other (See Comments)    Skin burns  . Nitrofurantoin Macrocrystal   . Other     Pt states she is allergic to some drug but can't remember- she will call us with info.  . Sulfasalazine Other (See Comments)    Pain in ears  . Iodinated Diagnostic Agents Itching    Skin burns Skin burns   . Prednisone Palpitations    Objective:  General: Alert and oriented x3 in no acute distress  Dermatology: Keratotic lesion present at styloid process on right with skin lines transversing the lesion, pain is present with direct pressure to the lesion with a central nucleated core noted, no webspace macerations, no ecchymosis bilateral, all nails x 10 are mildly elongated and thick.   Vascular: Dorsalis Pedis and Posterior Tibial pedal pulses 0/4, Capillary Fill Time 5 seconds, no pedal hair growth bilateral, +1 edema bilateral lower extremities, Temperature gradient decreased with hyperpigmentation bilateral consistent with PVD.   Neurology: [name] sensation intact via light touch bilateral.  Musculoskeletal: Mild tenderness with palpation at the keratotic lesion site on Right, Muscular strength 4/5 in all groups without pain or limitation on range of motion.  Assessment and Plan: Problem List Items Addressed This Visit      Musculoskeletal and Integument   Porokeratosis - Primary    Other Visit Diagnoses    Right foot pain          -Complete examination performed -Discussed treatment options -Parred keratoic lesion using a chisel blade x 1 right foot; treated the area withSalinocaine covered with Band-Aid -Encouraged daily skin  emollients, gave sample of foot miracle cream -Encouraged use of pumice stone -Advised good supportive shoes and inserts like before to help prevent rubbing on the lateral side of the right foot -Patient to return to office in 2 months for nail and callus care or sooner if condition worsens.  Landis Martins, DPM

## 2020-03-22 ENCOUNTER — Other Ambulatory Visit: Payer: Self-pay

## 2020-03-22 ENCOUNTER — Encounter: Payer: Self-pay | Admitting: Sports Medicine

## 2020-03-22 ENCOUNTER — Ambulatory Visit (INDEPENDENT_AMBULATORY_CARE_PROVIDER_SITE_OTHER): Payer: Medicare Other | Admitting: Sports Medicine

## 2020-03-22 DIAGNOSIS — M79675 Pain in left toe(s): Secondary | ICD-10-CM

## 2020-03-22 DIAGNOSIS — B351 Tinea unguium: Secondary | ICD-10-CM

## 2020-03-22 DIAGNOSIS — I739 Peripheral vascular disease, unspecified: Secondary | ICD-10-CM | POA: Diagnosis not present

## 2020-03-22 DIAGNOSIS — M79674 Pain in right toe(s): Secondary | ICD-10-CM

## 2020-03-22 DIAGNOSIS — Q828 Other specified congenital malformations of skin: Secondary | ICD-10-CM

## 2020-03-22 DIAGNOSIS — M79671 Pain in right foot: Secondary | ICD-10-CM

## 2020-03-22 NOTE — Progress Notes (Signed)
Subjective: DELYNNE SHRYOCK is a 79 y.o. female patient seen today in office with complaint of mildly painful thickened and elongated toenails; unable to trim. Patient reports that she is still on blood thinning.  Patient denies any pain at the right foot at area of previous callus/wart.  Patient has no other pedal complaints at this time.   Patient Active Problem List   Diagnosis Date Noted  . Subretinal hemorrhage of left eye 12/09/2018  . Primary hyperparathyroidism ([place]) 04/10/2018  . Restrictive lung disease 01/07/2018  . Vitamin B12 deficiency 10/02/2017  . Hematuria 04/18/2017  . Umbilical hernia without obstruction and without gangrene 04/18/2017  . Other osteoporosis without current pathological fracture 04/17/2017  . Advanced nonexudative age-related macular degeneration of right eye without subfoveal involvement 03/13/2017  . Combined forms of age-related cataract of right eye 03/13/2017  . PVD (posterior vitreous detachment), bilateral 03/13/2017  . Chronic sinusitis 08/30/2016  . Malaise and fatigue 08/30/2016  . Calcaneal spur of left foot 06/11/2016  . Plantar fat pad atrophy of left foot 06/11/2016  . Left calcaneal bursitis 05/16/2016  . Porokeratosis 05/16/2016  . Venous insufficiency of both lower extremities 05/16/2016  . Anticoagulant long-term use 02/28/2016  . CAD in native artery 02/28/2016  . CKD (chronic kidney disease) stage 3, GFR 30-59 ml/min 02/28/2016  . Gastroesophageal reflux disease without esophagitis 02/28/2016  . High risk medication use 02/28/2016  . Localized edema 02/28/2016  . Malignant neoplasm of colon ([place]) 02/28/2016  . Mixed hyperlipidemia 02/28/2016  . Morbid obesity with BMI of 40.0-44.9, adult ([place]) 02/28/2016  . Primary osteoarthritis involving multiple joints 02/28/2016  . Vitamin D deficiency 02/28/2016  . Moderate persistent asthma without complication [id number]  . Atypical ductal hyperplasia of left breast 12/01/2013  .  Diabetes mellitus ([place]) 11/23/2013  . Permanent atrial fibrillation ([place]) 11/16/2013    Current Outpatient Medications on File Prior to Visit  Medication Sig Dispense Refill  . albuterol (PROAIR HFA) 108 (90 Base) MCG/ACT inhaler Inhale into the lungs.    Marland Kitchen BREO ELLIPTA 100-25 MCG/INH AEPB     . clindamycin (CLEOCIN) 300 MG capsule Take 300 mg by mouth 3 (three) times daily.    . Cyanocobalamin (VITAMIN B-12 PO) Take by mouth.    . enalapril (VASOTEC) 10 MG tablet 2 (two) times daily.     . fluticasone (FLONASE) 50 MCG/ACT nasal spray Place 2 sprays into both nostrils daily. 15.8 mL 2  . furosemide (LASIX) 40 MG tablet Take 40 mg by mouth 2 (two) times daily.     Marland Kitchen gentamicin ointment (GARAMYCIN) 0.1 % Apply 1 application topically 3 (three) times daily.    Marland Kitchen HYDROcodone-acetaminophen (NORCO) 7.5-325 MG tablet for pain  0  . metoprolol (LOPRESSOR) 50 MG tablet 2 (two) times daily. Pt takes she takes 1/2 bid    . Multiple Vitamins-Minerals (PRESERVISION AREDS 2 PO) Take by mouth.    . nitroGLYCERIN (NITRODUR - DOSED IN MG/24 HR) 0.4 mg/hr Place 1 patch onto the skin daily.     Marland Kitchen OMEPRAZOLE PO Take by mouth.    . rosuvastatin (CRESTOR) 10 MG tablet Take 10 mg by mouth daily.    Marland Kitchen SPIRONOLACTONE PO Take by mouth.    . triamcinolone cream (KENALOG) 0.1 % Apply 1 application topically 2 (two) times daily.    . Vitamin D, Ergocalciferol, (DRISDOL) 1.25 MG (50000 UT) CAPS capsule TAKE 1 CAPSULE BY MOUTH ONCE WEEKLY 12 capsule 4  . XARELTO 20 MG TABS Take 20 mg by mouth  daily.      No current facility-administered medications on file prior to visit.    Allergies  Allergen Reactions  . Alprazolam Palpitations    palpitations palpitations   . Pseudoephedrine Palpitations  . Sulfa Antibiotics Anaphylaxis    Pain in ears Pain in ears  . Nitrofurantoin Rash  . Sulfamethoxazole-Trimethoprim Tinitus  . Amoxicillin Other (See Comments)    Pain in legs   . Ampicillin Other (See Comments)     Pain in legs   . Cephalexin Other (See Comments)    Stomach get real raw  . Iodine Hives  . Metrizamide Other (See Comments)    Skin burns  . Nitrofurantoin Macrocrystal   . Other     Pt states she is allergic to some drug but can't remember- she will call us with info.  . Sulfasalazine Other (See Comments)    Pain in ears  . Iodinated Diagnostic Agents Itching    Skin burns Skin burns   . Prednisone Palpitations    Objective: Physical Exam  General: Well developed, nourished, no acute distress, awake, alert and oriented x 3  Vascular: Dorsalis pedis artery 1/4 bilateral, Posterior tibial artery 0/4 bilateral, skin temperature warm to warm proximal to distal bilateral lower extremities, ++ varicosities, no pedal hair present bilateral.  Neurological: Gross sensation present via light touch bilateral.   Dermatological: Skin is warm, dry, and supple bilateral, Nails 1-10 are tender, long, thick, and discolored with mild subungal debris, no webspace macerations present bilateral, no open lesions present bilateral, minimal hyperkeratotic tissue present right lateral foot. No signs of infection bilateral.  Musculoskeletal: Asymptomatic pes planus boney deformities noted bilateral. Muscular strength within normal limits without painon range of motion. No pain with calf compression bilateral.  Assessment and Plan:  Problem List Items Addressed This Visit      Musculoskeletal and Integument   Porokeratosis    Other Visit Diagnoses    Pain due to onychomycosis of toenails of both feet    -  Primary   Right foot pain       PVD (peripheral vascular disease) ([place])          -Examined patient.  -Discussed treatment options for painful mycotic nails. -Mechanically debrided and reduced mycotic nails with sterile nail nipper and dremel nail file without incident. -Previous keratosis at right lateral foot is resolved; recommend continue with foot miracle -Patient to return in 3 months  for follow up evaluation or sooner if symptoms worsen.  Landis Martins, DPM

## 2020-05-05 ENCOUNTER — Inpatient Hospital Stay: Payer: Medicare Other

## 2020-05-05 ENCOUNTER — Inpatient Hospital Stay: Payer: Medicare Other | Admitting: Hematology & Oncology

## 2020-06-02 ENCOUNTER — Other Ambulatory Visit: Payer: Self-pay

## 2020-06-02 ENCOUNTER — Ambulatory Visit (INDEPENDENT_AMBULATORY_CARE_PROVIDER_SITE_OTHER): Payer: Medicare Other | Admitting: Sports Medicine

## 2020-06-02 DIAGNOSIS — M79675 Pain in left toe(s): Secondary | ICD-10-CM

## 2020-06-02 DIAGNOSIS — B351 Tinea unguium: Secondary | ICD-10-CM | POA: Diagnosis not present

## 2020-06-02 DIAGNOSIS — Q828 Other specified congenital malformations of skin: Secondary | ICD-10-CM

## 2020-06-02 DIAGNOSIS — M79674 Pain in right toe(s): Secondary | ICD-10-CM | POA: Diagnosis not present

## 2020-06-02 DIAGNOSIS — I739 Peripheral vascular disease, unspecified: Secondary | ICD-10-CM

## 2020-06-02 DIAGNOSIS — M79671 Pain in right foot: Secondary | ICD-10-CM

## 2020-06-03 ENCOUNTER — Encounter: Payer: Self-pay | Admitting: Sports Medicine

## 2020-06-03 NOTE — Progress Notes (Signed)
Subjective: Kathryn Steele is a 79 y.o. female patient seen today in office with complaint of mildly painful thickened and elongated toenails; unable to trim. Patient reports that she is still on blood thinner and admits that she is experiencing swelling in legs. No other issues.   Patient Active Problem List   Diagnosis Date Noted  . Subretinal hemorrhage of left eye 12/09/2018  . Primary hyperparathyroidism ([place]) 04/10/2018  . Restrictive lung disease 01/07/2018  . Vitamin B12 deficiency 10/02/2017  . Hematuria 04/18/2017  . Umbilical hernia without obstruction and without gangrene 04/18/2017  . Other osteoporosis without current pathological fracture 04/17/2017  . Advanced nonexudative age-related macular degeneration of right eye without subfoveal involvement 03/13/2017  . Combined forms of age-related cataract of right eye 03/13/2017  . PVD (posterior vitreous detachment), bilateral 03/13/2017  . Chronic sinusitis 08/30/2016  . Malaise and fatigue 08/30/2016  . Calcaneal spur of left foot 06/11/2016  . Plantar fat pad atrophy of left foot 06/11/2016  . Left calcaneal bursitis 05/16/2016  . Porokeratosis 05/16/2016  . Venous insufficiency of both lower extremities 05/16/2016  . Anticoagulant long-term use 02/28/2016  . CAD in native artery 02/28/2016  . CKD (chronic kidney disease) stage 3, GFR 30-59 ml/min 02/28/2016  . Gastroesophageal reflux disease without esophagitis 02/28/2016  . High risk medication use 02/28/2016  . Localized edema 02/28/2016  . Malignant neoplasm of colon ([place]) 02/28/2016  . Mixed hyperlipidemia 02/28/2016  . Morbid obesity with BMI of 40.0-44.9, adult ([place]) 02/28/2016  . Primary osteoarthritis involving multiple joints 02/28/2016  . Vitamin D deficiency 02/28/2016  . Moderate persistent asthma without complication 16/08/9603  . Atypical ductal hyperplasia of left breast 12/01/2013  . Diabetes mellitus ([place]) 11/23/2013  . Permanent atrial  fibrillation ([place]) 11/16/2013    Current Outpatient Medications on File Prior to Visit  Medication Sig Dispense Refill  . albuterol (PROAIR HFA) 108 (90 Base) MCG/ACT inhaler Inhale into the lungs.    Marland Kitchen BREO ELLIPTA 100-25 MCG/INH AEPB     . clindamycin (CLEOCIN) 300 MG capsule Take 300 mg by mouth 3 (three) times daily.    . Cyanocobalamin (VITAMIN B-12 PO) Take by mouth.    . enalapril (VASOTEC) 10 MG tablet 2 (two) times daily.     . fluticasone (FLONASE) 50 MCG/ACT nasal spray Place 2 sprays into both nostrils daily. 15.8 mL 2  . furosemide (LASIX) 40 MG tablet Take 40 mg by mouth 2 (two) times daily.     Marland Kitchen gentamicin ointment (GARAMYCIN) 0.1 % Apply 1 application topically 3 (three) times daily.    Marland Kitchen HYDROcodone-acetaminophen (NORCO) 7.5-325 MG tablet for pain  0  . metoprolol (LOPRESSOR) 50 MG tablet 2 (two) times daily. Pt takes she takes 1/2 bid    . Multiple Vitamins-Minerals (PRESERVISION AREDS 2 PO) Take by mouth.    . nitroGLYCERIN (NITRODUR - DOSED IN MG/24 HR) 0.4 mg/hr Place 1 patch onto the skin daily.     Marland Kitchen OMEPRAZOLE PO Take by mouth.    . rosuvastatin (CRESTOR) 10 MG tablet Take 10 mg by mouth daily.    Marland Kitchen SPIRONOLACTONE PO Take by mouth.    . triamcinolone cream (KENALOG) 0.1 % Apply 1 application topically 2 (two) times daily.    . Vitamin D, Ergocalciferol, (DRISDOL) 1.25 MG (50000 UT) CAPS capsule TAKE 1 CAPSULE BY MOUTH ONCE WEEKLY 12 capsule 4  . XARELTO 20 MG TABS Take 20 mg by mouth daily.      No current facility-administered medications on file  prior to visit.    Allergies  Allergen Reactions  . Alprazolam Palpitations    palpitations palpitations   . Pseudoephedrine Palpitations  . Sulfa Antibiotics Anaphylaxis    Pain in ears Pain in ears  . Nitrofurantoin Rash  . Sulfamethoxazole-Trimethoprim Tinitus  . Amoxicillin Other (See Comments)    Pain in legs   . Ampicillin Other (See Comments)    Pain in legs   . Cephalexin Other (See Comments)     Stomach get real raw  . Clindamycin Swelling  . Iodine Hives  . Metrizamide Other (See Comments)    Skin burns  . Nitrofurantoin Macrocrystal   . Other     Pt states she is allergic to some drug but can't remember- she will call us with info.  . Sulfasalazine Other (See Comments)    Pain in ears  . Iodinated Diagnostic Agents Itching    Skin burns Skin burns   . Prednisone Palpitations    Objective: Physical Exam  General: Well developed, nourished, no acute distress, awake, alert and oriented x 3  Vascular: Dorsalis pedis artery 1/4 bilateral, Posterior tibial artery 0/4 bilateral, skin temperature warm to warm proximal to distal bilateral lower extremities, ++ varicosities, trace edema to ankles bilateral, no pedal hair present bilateral.  Neurological: Gross sensation present via light touch bilateral.   Dermatological: Skin is warm, dry, and supple bilateral, Nails 1-10 are tender, long, thick, and discolored with mild subungal debris, no webspace macerations present bilateral, no open lesions present bilateral, minimal hyperkeratotic tissue present right lateral foot. No signs of infection bilateral.  Musculoskeletal: Asymptomatic pes planus boney deformities noted bilateral. Muscular strength within normal limits without painon range of motion. No pain with calf compression bilateral.  Assessment and Plan:  Problem List Items Addressed This Visit      Musculoskeletal and Integument   Porokeratosis    Other Visit Diagnoses    Pain due to onychomycosis of toenails of both feet    -  Primary   Right foot pain       PVD (peripheral vascular disease) ([place])         -Examined patient.  -Re-discussed treatment options for painful mycotic nails. -Mechanically debrided and reduced mycotic nails with sterile nail nipper and dremel nail file without incident. -Continue with foot miracle for keratosis -Advised compression garments for edema control -Patient to return in 3  months for follow up evaluation or sooner if symptoms worsen.  Landis Martins, DPM

## 2020-06-15 ENCOUNTER — Other Ambulatory Visit: Payer: Self-pay | Admitting: *Deleted

## 2020-06-15 DIAGNOSIS — N6092 Unspecified benign mammary dysplasia of left breast: Secondary | ICD-10-CM

## 2020-06-16 ENCOUNTER — Inpatient Hospital Stay: Payer: Medicare Other | Attending: Hematology & Oncology

## 2020-06-16 ENCOUNTER — Encounter: Payer: Self-pay | Admitting: Hematology & Oncology

## 2020-06-16 ENCOUNTER — Inpatient Hospital Stay (HOSPITAL_BASED_OUTPATIENT_CLINIC_OR_DEPARTMENT_OTHER): Payer: Medicare Other | Admitting: Hematology & Oncology

## 2020-06-16 ENCOUNTER — Other Ambulatory Visit: Payer: Self-pay

## 2020-06-16 ENCOUNTER — Telehealth: Payer: Self-pay | Admitting: Hematology & Oncology

## 2020-06-16 VITALS — BP 144/72 | HR 74 | Temp 98.7°F | Resp 18 | Ht 62.5 in | Wt 220.0 lb

## 2020-06-16 DIAGNOSIS — Z79899 Other long term (current) drug therapy: Secondary | ICD-10-CM | POA: Diagnosis not present

## 2020-06-16 DIAGNOSIS — N6092 Unspecified benign mammary dysplasia of left breast: Secondary | ICD-10-CM

## 2020-06-16 DIAGNOSIS — Z7951 Long term (current) use of inhaled steroids: Secondary | ICD-10-CM | POA: Insufficient documentation

## 2020-06-16 DIAGNOSIS — Z7901 Long term (current) use of anticoagulants: Secondary | ICD-10-CM | POA: Insufficient documentation

## 2020-06-16 DIAGNOSIS — I482 Chronic atrial fibrillation, unspecified: Secondary | ICD-10-CM | POA: Diagnosis not present

## 2020-06-16 LAB — CBC WITH DIFFERENTIAL (CANCER CENTER ONLY)
Abs Immature Granulocytes: 0.01 10*3/uL (ref 0.00–0.07)
Basophils Absolute: 0 10*3/uL (ref 0.0–0.1)
Basophils Relative: 1 %
Eosinophils Absolute: 0.1 10*3/uL (ref 0.0–0.5)
Eosinophils Relative: 1 %
HCT: 41.1 % (ref 36.0–46.0)
Hemoglobin: 13.2 g/dL (ref 12.0–15.0)
Immature Granulocytes: 0 %
Lymphocytes Relative: 35 %
Lymphs Abs: 2.2 10*3/uL (ref 0.7–4.0)
MCH: 31.1 pg (ref 26.0–34.0)
MCHC: 32.1 g/dL (ref 30.0–36.0)
MCV: 96.9 fL (ref 80.0–100.0)
Monocytes Absolute: 0.9 10*3/uL (ref 0.1–1.0)
Monocytes Relative: 14 %
Neutro Abs: 3.1 10*3/uL (ref 1.7–7.7)
Neutrophils Relative %: 49 %
Platelet Count: 171 10*3/uL (ref 150–400)
RBC: 4.24 MIL/uL (ref 3.87–5.11)
RDW: 13 % (ref 11.5–15.5)
WBC Count: 6.2 10*3/uL (ref 4.0–10.5)
nRBC: 0 % (ref 0.0–0.2)

## 2020-06-16 LAB — CMP (CANCER CENTER ONLY)
ALT: 11 U/L (ref 0–44)
AST: 20 U/L (ref 15–41)
Albumin: 4.5 g/dL (ref 3.5–5.0)
Alkaline Phosphatase: 76 U/L (ref 38–126)
Anion gap: 8 (ref 5–15)
BUN: 19 mg/dL (ref 8–23)
CO2: 31 mmol/L (ref 22–32)
Calcium: 11.8 mg/dL — ABNORMAL HIGH (ref 8.9–10.3)
Chloride: 103 mmol/L (ref 98–111)
Creatinine: 0.97 mg/dL (ref 0.44–1.00)
GFR, Est AFR Am: >60 mL/min (ref >60)
GFR, Estimated: 55 mL/min — ABNORMAL LOW (ref >60)
Glucose, Bld: 107 mg/dL — ABNORMAL HIGH (ref 70–99)
Potassium: 4.1 mmol/L (ref 3.5–5.1)
Sodium: 142 mmol/L (ref 135–145)
Total Bilirubin: 0.9 mg/dL (ref 0.3–1.2)
Total Protein: 7.2 g/dL (ref 6.5–8.1)

## 2020-06-16 MED ORDER — TOBREX 0.3 % OP OINT
1.0000 | TOPICAL_OINTMENT | Freq: Two times a day (BID) | OPHTHALMIC | 0 refills | Status: DC
Start: 2020-06-16 — End: 2022-03-15

## 2020-06-16 NOTE — Telephone Encounter (Signed)
lmom to infrom pt of one year follow up 06/15/21 at 1130 am per 7/30 LOS

## 2020-06-16 NOTE — Progress Notes (Signed)
Hematology and Oncology Follow Up Visit  PRESTON WEILL [id number] Nov 17, 1941 79 y.o. 06/16/2020   Principle Diagnosis:   Atypical ductal hyperplasia of the left breast  Chronic atrial fibrillation  Current Therapy:    Aromasin 25 mg by mouth daily -- d/c on 04/17/2018  Xarelto 20 mg by mouth daily     Interim History:  Ms.  Balthazar is back for follow-up. It is been a little bit over a year since we last saw her.  She is having a little bit of stress right now.  It sounds like the macular degeneration is becoming more of a problem.  It sounds like they cannot do much with the left eye.  I know that they are giving her injections to the right eye.  She has some purulent discharge from the right eye.  She has a little bit of erythema of the conjunctive.  She does have little bit of a stye on the upper eyelid on the right.  We will go ahead and put her on Tobrex ophthalmic ointment.  I told her to put half-inch ribbon on the lower eyelid twice a day.  I think she sees her ophthalmologist in a couple weeks.  She had did have a mammogram about a week ago.  The mammogram looked fantastic.  She is on Xarelto for the atrial fibrillation.  I think she is on Imdur now also for atrial fibrillation.  She has had a couple admissions to the hospital.  There has been no problems with bowels or bladder.    She and her husband still are very cautious with the coronavirus.    Overall, her performance status is ECOG 1.    Medications:  Current Outpatient Medications:  .  albuterol (PROAIR HFA) 108 (90 Base) MCG/ACT inhaler, Inhale into the lungs., Disp: , Rfl:  .  BREO ELLIPTA 100-25 MCG/INH AEPB, , Disp: , Rfl:  .  clindamycin (CLEOCIN) 300 MG capsule, Take 300 mg by mouth 3 (three) times daily., Disp: , Rfl:  .  Cyanocobalamin (VITAMIN B-12 PO), Take by mouth., Disp: , Rfl:  .  enalapril (VASOTEC) 10 MG tablet, 2 (two) times daily. , Disp: , Rfl:  .  fluticasone (FLONASE) 50 MCG/ACT  nasal spray, Place 2 sprays into both nostrils daily., Disp: 15.8 mL, Rfl: 2 .  furosemide (LASIX) 40 MG tablet, Take 40 mg by mouth 2 (two) times daily. , Disp: , Rfl:  .  gentamicin ointment (GARAMYCIN) 0.1 %, Apply 1 application topically 3 (three) times daily., Disp: , Rfl:  .  HYDROcodone-acetaminophen (NORCO) 7.5-325 MG tablet, for pain, Disp: , Rfl: 0 .  metoprolol (LOPRESSOR) 50 MG tablet, 2 (two) times daily. Pt takes she takes 1/2 bid, Disp: , Rfl:  .  Multiple Vitamins-Minerals (PRESERVISION AREDS 2 PO), Take by mouth., Disp: , Rfl:  .  nitroGLYCERIN (NITRODUR - DOSED IN MG/24 HR) 0.4 mg/hr, Place 1 patch onto the skin daily. , Disp: , Rfl:  .  OMEPRAZOLE PO, Take by mouth., Disp: , Rfl:  .  rosuvastatin (CRESTOR) 10 MG tablet, Take 10 mg by mouth daily., Disp: , Rfl:  .  SPIRONOLACTONE PO, Take by mouth., Disp: , Rfl:  .  triamcinolone cream (KENALOG) 0.1 %, Apply 1 application topically 2 (two) times daily., Disp: , Rfl:  .  Vitamin D, Ergocalciferol, (DRISDOL) 1.25 MG (50000 UT) CAPS capsule, TAKE 1 CAPSULE BY MOUTH ONCE WEEKLY, Disp: 12 capsule, Rfl: 4 .  XARELTO 20 MG TABS, Take 20 mg  by mouth daily. , Disp: , Rfl:   Allergies:  Allergies  Allergen Reactions  . Alprazolam Palpitations    palpitations palpitations   . Pseudoephedrine Palpitations  . Sulfa Antibiotics Anaphylaxis    Pain in ears Pain in ears  . Nitrofurantoin Rash  . Sulfamethoxazole-Trimethoprim Tinitus  . Amoxicillin Other (See Comments)    Pain in legs   . Ampicillin Other (See Comments)    Pain in legs   . Cephalexin Other (See Comments)    Stomach get real raw  . Clindamycin Swelling  . Iodine Hives  . Metrizamide Other (See Comments)    Skin burns  . Nitrofurantoin Macrocrystal   . Other     Pt states she is allergic to some drug but can't remember- she will call us with info.  . Sulfasalazine Other (See Comments)    Pain in ears  . Iodinated Diagnostic Agents Itching    Skin  burns Skin burns   . Prednisone Palpitations    Past Medical History, Surgical history, Social history, and Family History were reviewed and updated.  Review of Systems: Review of Systems  Constitutional: Negative.   HENT: Negative.   Eyes: Positive for blurred vision.  Respiratory: Negative.   Cardiovascular: Positive for palpitations and leg swelling.  Gastrointestinal: Negative.   Genitourinary: Negative.   Musculoskeletal: Positive for myalgias.  Skin: Negative.   Neurological: Negative.   Endo/Heme/Allergies: Negative.   Psychiatric/Behavioral: Negative.      Physical Exam:  height is 5' 2.5" (1.588 m) and weight is 220 lb (99.8 kg) (abnormal). Her oral temperature is 98.7 F (37.1 C). Her blood pressure is 144/72 (abnormal) and her pulse is 74. Her respiration is 18 and oxygen saturation is 99%.   Physical Exam Vitals reviewed.  HENT:     Head: Normocephalic and atraumatic.  Eyes:     Pupils: Pupils are equal, round, and reactive to light.  Cardiovascular:     Rate and Rhythm: Normal rate and regular rhythm.     Heart sounds: Normal heart sounds.  Pulmonary:     Effort: Pulmonary effort is normal.     Breath sounds: Normal breath sounds.  Abdominal:     General: Bowel sounds are normal.     Palpations: Abdomen is soft.  Musculoskeletal:        General: No tenderness or deformity. Normal range of motion.     Cervical back: Normal range of motion.  Lymphadenopathy:     Cervical: No cervical adenopathy.  Skin:    General: Skin is warm and dry.     Findings: No erythema or rash.  Neurological:     Mental Status: She is alert and oriented to person, place, and time.  Psychiatric:        Behavior: Behavior normal.        Thought Content: Thought content normal.        Judgment: Judgment normal.      Lab Results  Component Value Date   WBC 6.2 06/16/2020   HGB 13.2 06/16/2020   HCT 41.1 06/16/2020   MCV 96.9 06/16/2020   PLT 171 06/16/2020      Chemistry      Component Value Date/Time   NA 142 06/16/2020 1214   NA 142 04/17/2017 1010   K 4.1 06/16/2020 1214   K 4.5 04/17/2017 1010   CL 103 06/16/2020 1214   CO2 31 06/16/2020 1214   CO2 28 04/17/2017 1010   BUN 19 06/16/2020 1214  BUN 13.9 04/17/2017 1010   CREATININE 0.97 06/16/2020 1214   CREATININE 1.0 04/17/2017 1010      Component Value Date/Time   CALCIUM 11.8 (H) 06/16/2020 1214   CALCIUM 10.7 (H) 04/17/2017 1010   ALKPHOS 76 06/16/2020 1214   ALKPHOS 110 04/17/2017 1010   AST 20 06/16/2020 1214   AST 17 04/17/2017 1010   ALT 11 06/16/2020 1214   ALT 11 04/17/2017 1010   BILITOT 0.9 06/16/2020 1214   BILITOT 0.59 04/17/2017 1010         Impression and Plan: Ms. Zenker is 79 year old white female. She has atypical ductal hyperplasia left breast. We have been following this now for over 7 years. There's been no evidence of progression to malignancy.  We will still get her back yearly.  She like to come back to see [place].  She just feels confident coming to our office and knowing that we will do a fairly thorough evaluation.  I feel bad that she has macular degeneration that seems to be worsening.  Volanda Napoleon, MD 7/30/20211:44 PM

## 2020-06-22 DIAGNOSIS — H0011 Chalazion right upper eyelid: Secondary | ICD-10-CM | POA: Insufficient documentation

## 2020-07-19 ENCOUNTER — Other Ambulatory Visit: Payer: Self-pay | Admitting: Hematology & Oncology

## 2020-09-06 ENCOUNTER — Encounter: Payer: Self-pay | Admitting: Sports Medicine

## 2020-09-06 ENCOUNTER — Other Ambulatory Visit: Payer: Self-pay

## 2020-09-06 ENCOUNTER — Ambulatory Visit (INDEPENDENT_AMBULATORY_CARE_PROVIDER_SITE_OTHER): Payer: Medicare Other | Admitting: Sports Medicine

## 2020-09-06 DIAGNOSIS — B351 Tinea unguium: Secondary | ICD-10-CM | POA: Diagnosis not present

## 2020-09-06 DIAGNOSIS — M79674 Pain in right toe(s): Secondary | ICD-10-CM | POA: Diagnosis not present

## 2020-09-06 DIAGNOSIS — M79675 Pain in left toe(s): Secondary | ICD-10-CM

## 2020-09-06 DIAGNOSIS — I739 Peripheral vascular disease, unspecified: Secondary | ICD-10-CM | POA: Diagnosis not present

## 2020-09-06 NOTE — Progress Notes (Signed)
Subjective: NEZIAH VOGELGESANG is a 79 y.o. female patient seen today in office with complaint of mildly painful thickened and elongated toenails; unable to trim. Patient reports that she is still on blood thinner and admits that compression stockings help swelling. No other issues.   Patient Active Problem List   Diagnosis Date Noted  . Subretinal hemorrhage of left eye 12/09/2018  . Primary hyperparathyroidism ([place]) 04/10/2018  . Restrictive lung disease 01/07/2018  . Vitamin B12 deficiency 10/02/2017  . Hematuria 04/18/2017  . Umbilical hernia without obstruction and without gangrene 04/18/2017  . Other osteoporosis without current pathological fracture 04/17/2017  . Advanced nonexudative age-related macular degeneration of right eye without subfoveal involvement 03/13/2017  . Combined forms of age-related cataract of right eye 03/13/2017  . PVD (posterior vitreous detachment), bilateral 03/13/2017  . Chronic sinusitis 08/30/2016  . Malaise and fatigue 08/30/2016  . Calcaneal spur of left foot 06/11/2016  . Plantar fat pad atrophy of left foot 06/11/2016  . Left calcaneal bursitis 05/16/2016  . Porokeratosis 05/16/2016  . Venous insufficiency of both lower extremities 05/16/2016  . Anticoagulant long-term use 02/28/2016  . CAD in native artery 02/28/2016  . CKD (chronic kidney disease) stage 3, GFR 30-59 ml/min ([facility]) 02/28/2016  . Gastroesophageal reflux disease without esophagitis 02/28/2016  . High risk medication use 02/28/2016  . Localized edema 02/28/2016  . Malignant neoplasm of colon ([place]) 02/28/2016  . Mixed hyperlipidemia 02/28/2016  . Morbid obesity with BMI of 40.0-44.9, adult ([place]) 02/28/2016  . Primary osteoarthritis involving multiple joints 02/28/2016  . Vitamin D deficiency 02/28/2016  . Moderate persistent asthma without complication 25/00/3704  . Atypical ductal hyperplasia of left breast 12/01/2013  . Diabetes mellitus ([place]) 11/23/2013  . Permanent atrial  fibrillation ([place]) 11/16/2013    Current Outpatient Medications on File Prior to Visit  Medication Sig Dispense Refill  . mupirocin ointment (BACTROBAN) 2 % Apply topically.    Marland Kitchen albuterol (PROAIR HFA) 108 (90 Base) MCG/ACT inhaler Inhale into the lungs.    Marland Kitchen BREO ELLIPTA 100-25 MCG/INH AEPB     . clindamycin (CLEOCIN) 300 MG capsule Take 300 mg by mouth 3 (three) times daily.    . Cyanocobalamin (VITAMIN B-12 PO) Take by mouth.    . enalapril (VASOTEC) 10 MG tablet 2 (two) times daily.     . fluticasone (FLONASE) 50 MCG/ACT nasal spray Place 2 sprays into both nostrils daily. 15.8 mL 2  . furosemide (LASIX) 40 MG tablet Take 40 mg by mouth 2 (two) times daily.     Marland Kitchen gentamicin ointment (GARAMYCIN) 0.1 % Apply 1 application topically 3 (three) times daily.    Marland Kitchen HYDROcodone-acetaminophen (NORCO) 7.5-325 MG tablet for pain  0  . isosorbide mononitrate (IMDUR) 30 MG 24 hr tablet Take 30 mg by mouth daily.    . metoprolol (LOPRESSOR) 50 MG tablet 2 (two) times daily. Pt takes she takes 1/2 bid    . Multiple Vitamins-Minerals (PRESERVISION AREDS 2 PO) Take by mouth.    . nitroGLYCERIN (NITRODUR - DOSED IN MG/24 HR) 0.4 mg/hr Place 1 patch onto the skin daily.     Marland Kitchen OMEPRAZOLE PO Take by mouth.    . rosuvastatin (CRESTOR) 10 MG tablet Take 10 mg by mouth daily.    Marland Kitchen SPIRONOLACTONE PO Take by mouth.    . tobramycin (TOBREX) 0.3 % ophthalmic ointment Place 1 application into the right eye 2 (two) times daily. 3.5 g 0  . triamcinolone cream (KENALOG) 0.1 % Apply 1 application topically 2 (  two) times daily.    . Vitamin D, Ergocalciferol, (DRISDOL) 1.25 MG (50000 UNIT) CAPS capsule TAKE 1 CAPSULE BY MOUTH ONCE WEEKLY 12 capsule 4  . XARELTO 20 MG TABS Take 20 mg by mouth daily.      No current facility-administered medications on file prior to visit.    Allergies  Allergen Reactions  . Alprazolam Palpitations    palpitations palpitations   . Pseudoephedrine Palpitations  . Sulfa  Antibiotics Anaphylaxis    Pain in ears Pain in ears  . Nitrofurantoin Rash  . Sulfamethoxazole-Trimethoprim Tinitus  . Amoxicillin Other (See Comments)    Pain in legs   . Ampicillin Other (See Comments)    Pain in legs   . Cephalexin Other (See Comments)    Stomach get real raw  . Clindamycin Swelling  . Iodine Hives  . Metrizamide Other (See Comments)    Skin burns  . Nitrofurantoin Macrocrystal   . Other     Pt states she is allergic to some drug but can't remember- she will call us with info.  . Sulfasalazine Other (See Comments)    Pain in ears  . Iodinated Diagnostic Agents Itching    Skin burns Skin burns   . Prednisone Palpitations    Objective: Physical Exam  General: Well developed, nourished, no acute distress, awake, alert and oriented x 3  Vascular: Dorsalis pedis artery 1/4 bilateral, Posterior tibial artery 0/4 bilateral, skin temperature warm to warm proximal to distal bilateral lower extremities, ++ varicosities, trace edema to ankles bilateral with compression stocking in place, no pedal hair present bilateral. History of right leg ulcer treated by wound center.   Neurological: Gross sensation present via light touch bilateral.   Dermatological: Skin is warm, dry, and supple bilateral, Nails 1-10 are tender, long, thick, and discolored with mild subungal debris, no webspace macerations present bilateral, no open lesions present bilateral, minimal hyperkeratotic tissue present right lateral foot. No signs of infection bilateral.  Musculoskeletal: Asymptomatic pes planus boney deformities noted bilateral. Muscular strength within normal limits without painon range of motion. No pain with calf compression bilateral.  Assessment and Plan:  Problem List Items Addressed This Visit    None    Visit Diagnoses    Pain due to onychomycosis of toenails of both feet    -  Primary   Relevant Medications   mupirocin ointment (BACTROBAN) 2 %   PVD (peripheral  vascular disease) ([facility])       Relevant Medications   isosorbide mononitrate (IMDUR) 30 MG 24 hr tablet     -Examined patient.  -Re-discussed treatment options for painful mycotic nails. -Mechanically debrided and reduced mycotic nails with sterile nail nipper and dremel nail file without incident. -Continue with compression garments for edema control -Patient to return in 3 months for follow up evaluation or sooner if symptoms worsen.  Landis Martins, DPM

## 2020-12-08 ENCOUNTER — Ambulatory Visit: Payer: Medicare Other | Admitting: Sports Medicine

## 2020-12-25 ENCOUNTER — Ambulatory Visit: Payer: Medicare Other | Admitting: Podiatry

## 2020-12-28 ENCOUNTER — Ambulatory Visit (INDEPENDENT_AMBULATORY_CARE_PROVIDER_SITE_OTHER): Payer: Medicare Other | Admitting: Podiatry

## 2020-12-28 ENCOUNTER — Other Ambulatory Visit: Payer: Self-pay

## 2020-12-28 ENCOUNTER — Encounter: Payer: Self-pay | Admitting: Podiatry

## 2020-12-28 DIAGNOSIS — M79674 Pain in right toe(s): Secondary | ICD-10-CM | POA: Diagnosis not present

## 2020-12-28 DIAGNOSIS — L84 Corns and callosities: Secondary | ICD-10-CM

## 2020-12-28 DIAGNOSIS — M79675 Pain in left toe(s): Secondary | ICD-10-CM | POA: Diagnosis not present

## 2020-12-28 DIAGNOSIS — D689 Coagulation defect, unspecified: Secondary | ICD-10-CM | POA: Diagnosis not present

## 2020-12-28 DIAGNOSIS — B351 Tinea unguium: Secondary | ICD-10-CM | POA: Diagnosis not present

## 2020-12-28 NOTE — Progress Notes (Signed)
  Subjective:  Patient ID: Kathryn Steele, female    DOB: 1941/10/25,  MRN: [id number]  Chief Complaint  Patient presents with  . Nail Problem    Trim nails    80 y.o. female presents with the above complaint. History confirmed with patient. On Xarelto  Objective:  Physical Exam: warm, good capillary refill, nail exam onychomycosis of the toenails, no trophic changes or ulcerative lesions. DP pulses palpable, PT pulses palpable and protective sensation intact Left Foot: normal exam, no swelling, tenderness, instability; ligaments intact, full range of motion of all ankle/foot joints  Right Foot: POP submet 5 head, base with HPKs  No images are attached to the encounter.  Assessment:   1. Pain due to onychomycosis of toenails of both feet   2. Coagulation defect ([place])   3. Callus      Plan:  Patient was evaluated and treated and all questions answered.  Onychomycosis and Coagulation Defect -Nails palliatively debrided secondary to pain  Procedure: Nail Debridement Type of Debridement: manual, sharp debridement. Instrumentation: Nail nipper, rotary burr. Number of Nails: 10    No follow-ups on file.

## 2021-01-18 DIAGNOSIS — K458 Other specified abdominal hernia without obstruction or gangrene: Secondary | ICD-10-CM | POA: Insufficient documentation

## 2021-03-06 DIAGNOSIS — K5904 Chronic idiopathic constipation: Secondary | ICD-10-CM | POA: Insufficient documentation

## 2021-03-27 ENCOUNTER — Other Ambulatory Visit: Payer: Self-pay

## 2021-03-27 ENCOUNTER — Encounter: Payer: Self-pay | Admitting: Sports Medicine

## 2021-03-27 ENCOUNTER — Ambulatory Visit (INDEPENDENT_AMBULATORY_CARE_PROVIDER_SITE_OTHER): Payer: Medicare Other | Admitting: Sports Medicine

## 2021-03-27 DIAGNOSIS — D689 Coagulation defect, unspecified: Secondary | ICD-10-CM | POA: Diagnosis not present

## 2021-03-27 DIAGNOSIS — I739 Peripheral vascular disease, unspecified: Secondary | ICD-10-CM

## 2021-03-27 DIAGNOSIS — Q828 Other specified congenital malformations of skin: Secondary | ICD-10-CM

## 2021-03-27 DIAGNOSIS — M79674 Pain in right toe(s): Secondary | ICD-10-CM

## 2021-03-27 DIAGNOSIS — B351 Tinea unguium: Secondary | ICD-10-CM

## 2021-03-27 DIAGNOSIS — L84 Corns and callosities: Secondary | ICD-10-CM

## 2021-03-27 DIAGNOSIS — M79675 Pain in left toe(s): Secondary | ICD-10-CM | POA: Diagnosis not present

## 2021-03-27 DIAGNOSIS — M79671 Pain in right foot: Secondary | ICD-10-CM

## 2021-03-27 NOTE — Progress Notes (Signed)
Subjective: Kathryn Steele is a 80 y.o. female patient seen today in office with complaint of mildly painful thickened and elongated toenails and callus on right; unable to trim. Patient reports that she is still on blood thinner and admits that compression stockings help swelling like previous does sometimes occasionally get a little bit of numbness on the right foot that goes away. No other issues.   Patient Active Problem List   Diagnosis Date Noted  . Subretinal hemorrhage of left eye 12/09/2018  . Primary hyperparathyroidism ([place]) 04/10/2018  . Restrictive lung disease 01/07/2018  . Vitamin B12 deficiency 10/02/2017  . Hematuria 04/18/2017  . Umbilical hernia without obstruction and without gangrene 04/18/2017  . Other osteoporosis without current pathological fracture 04/17/2017  . Advanced nonexudative age-related macular degeneration of right eye without subfoveal involvement 03/13/2017  . Combined forms of age-related cataract of right eye 03/13/2017  . PVD (posterior vitreous detachment), bilateral 03/13/2017  . Chronic sinusitis 08/30/2016  . Malaise and fatigue 08/30/2016  . Calcaneal spur of left foot 06/11/2016  . Plantar fat pad atrophy of left foot 06/11/2016  . Left calcaneal bursitis 05/16/2016  . Porokeratosis 05/16/2016  . Venous insufficiency of both lower extremities 05/16/2016  . Anticoagulant long-term use 02/28/2016  . CAD in native artery 02/28/2016  . CKD (chronic kidney disease) stage 3, GFR 30-59 ml/min (HCC) 02/28/2016  . Gastroesophageal reflux disease without esophagitis 02/28/2016  . High risk medication use 02/28/2016  . Localized edema 02/28/2016  . Malignant neoplasm of colon ([place]) 02/28/2016  . Mixed hyperlipidemia 02/28/2016  . Morbid obesity with BMI of 40.0-44.9, adult ([place]) 02/28/2016  . Primary osteoarthritis involving multiple joints 02/28/2016  . Vitamin D deficiency 02/28/2016  . Moderate persistent asthma without complication  41/66/0630  . Atypical ductal hyperplasia of left breast 12/01/2013  . Diabetes mellitus ([place]) 11/23/2013  . Permanent atrial fibrillation ([place]) 11/16/2013    Current Outpatient Medications on File Prior to Visit  Medication Sig Dispense Refill  . albuterol (PROAIR HFA) 108 (90 Base) MCG/ACT inhaler Inhale into the lungs.    Marland Kitchen BREO ELLIPTA 100-25 MCG/INH AEPB     . clindamycin (CLEOCIN) 300 MG capsule Take 300 mg by mouth 3 (three) times daily.    . Cyanocobalamin (VITAMIN B-12 PO) Take by mouth.    . enalapril (VASOTEC) 10 MG tablet 2 (two) times daily.     . fluticasone (FLONASE) 50 MCG/ACT nasal spray Place 2 sprays into both nostrils daily. 15.8 mL 2  . furosemide (LASIX) 40 MG tablet Take 40 mg by mouth 2 (two) times daily.     Marland Kitchen gentamicin ointment (GARAMYCIN) 0.1 % Apply 1 application topically 3 (three) times daily.    Marland Kitchen HYDROcodone-acetaminophen (NORCO) 7.5-325 MG tablet for pain  0  . isosorbide mononitrate (IMDUR) 30 MG 24 hr tablet Take 30 mg by mouth daily.    . metoprolol (LOPRESSOR) 50 MG tablet 2 (two) times daily. Pt takes she takes 1/2 bid    . Multiple Vitamins-Minerals (PRESERVISION AREDS 2 PO) Take by mouth.    . mupirocin ointment (BACTROBAN) 2 % Apply topically.    . nitroGLYCERIN (NITRODUR - DOSED IN MG/24 HR) 0.4 mg/hr Place 1 patch onto the skin daily.     Marland Kitchen OMEPRAZOLE PO Take by mouth.    . rosuvastatin (CRESTOR) 10 MG tablet Take 10 mg by mouth daily.    Marland Kitchen SPIRONOLACTONE PO Take by mouth.    . tobramycin (TOBREX) 0.3 % ophthalmic ointment Place 1 application into  the right eye 2 (two) times daily. 3.5 g 0  . triamcinolone cream (KENALOG) 0.1 % Apply 1 application topically 2 (two) times daily.    . Vitamin D, Ergocalciferol, (DRISDOL) 1.25 MG (50000 UNIT) CAPS capsule TAKE 1 CAPSULE BY MOUTH ONCE WEEKLY 12 capsule 4  . XARELTO 20 MG TABS Take 20 mg by mouth daily.      No current facility-administered medications on file prior to visit.    Allergies   Allergen Reactions  . Alprazolam Palpitations    palpitations palpitations   . Pseudoephedrine Palpitations  . Sulfa Antibiotics Anaphylaxis    Pain in ears Pain in ears  . Nitrofurantoin Rash  . Sulfamethoxazole-Trimethoprim Tinitus  . Amoxicillin Other (See Comments)    Pain in legs   . Ampicillin Other (See Comments)    Pain in legs   . Cephalexin Other (See Comments)    Stomach get real raw  . Clindamycin Swelling  . Iodine Hives  . Metrizamide Other (See Comments)    Skin burns  . Nitrofurantoin Macrocrystal   . Other     Pt states she is allergic to some drug but can't remember- she will call us with info.  . Sulfasalazine Other (See Comments)    Pain in ears  . Iodinated Diagnostic Agents Itching    Skin burns Skin burns   . Prednisone Palpitations    Objective: Physical Exam  General: Well developed, nourished, no acute distress, awake, alert and oriented x 3  Vascular: Dorsalis pedis artery 1/4 bilateral, Posterior tibial artery 0/4 bilateral, skin temperature warm to warm proximal to distal bilateral lower extremities, ++ varicosities, trace edema to ankles bilateral with compression stocking in place, no pedal hair present bilateral. History of right leg ulcer treated by wound center.   Neurological: Gross sensation present via light touch bilateral.   Dermatological: Skin is warm, dry, and supple bilateral, Nails 1-10 are tender, long, thick, and discolored with mild subungal debris, no webspace macerations present bilateral, no open lesions present bilateral, minimal hyperkeratotic tissue present right lateral foot. No signs of infection bilateral.  Musculoskeletal: Asymptomatic pes planus boney deformities noted bilateral. Muscular strength within normal limits without painon range of motion. No pain with calf compression bilateral.  Assessment and Plan:  Problem List Items Addressed This Visit      Musculoskeletal and Integument   Porokeratosis     Other Visit Diagnoses    Pain due to onychomycosis of toenails of both feet    -  Primary   Coagulation defect ([place])       Callus       PVD (peripheral vascular disease) ([place])       Right foot pain         -Examined patient.  -Re-discussed treatment options for painful mycotic nails. -Mechanically debrided and reduced mycotic nails with sterile nail nipper and dremel nail file without incident. -Mechanically debrided keratosis using a sterile 15 blade at right lateral foot and applied Salinocane and Band-Aid at no additional charge -Continue with compression garments for edema control -Advised patient that we will closely monitor occasional numbness likely could be the result of extensive swelling versus use of compression garment that could cause some compression neuritis however at this time we will monitor since the pain is not currently present -Patient to return in 3 months for follow up evaluation or sooner if symptoms worsen.  Landis Martins, DPM

## 2021-06-15 ENCOUNTER — Inpatient Hospital Stay: Payer: Medicare Other

## 2021-06-15 ENCOUNTER — Inpatient Hospital Stay: Payer: Medicare Other | Admitting: Hematology & Oncology

## 2021-06-27 ENCOUNTER — Ambulatory Visit (INDEPENDENT_AMBULATORY_CARE_PROVIDER_SITE_OTHER): Payer: Medicare Other | Admitting: Sports Medicine

## 2021-06-27 ENCOUNTER — Encounter: Payer: Self-pay | Admitting: Sports Medicine

## 2021-06-27 ENCOUNTER — Other Ambulatory Visit: Payer: Self-pay

## 2021-06-27 DIAGNOSIS — M79674 Pain in right toe(s): Secondary | ICD-10-CM

## 2021-06-27 DIAGNOSIS — M79675 Pain in left toe(s): Secondary | ICD-10-CM | POA: Diagnosis not present

## 2021-06-27 DIAGNOSIS — I739 Peripheral vascular disease, unspecified: Secondary | ICD-10-CM | POA: Diagnosis not present

## 2021-06-27 DIAGNOSIS — B351 Tinea unguium: Secondary | ICD-10-CM

## 2021-06-27 DIAGNOSIS — D689 Coagulation defect, unspecified: Secondary | ICD-10-CM

## 2021-06-27 NOTE — Progress Notes (Signed)
Subjective: Kathryn Steele is a 80 y.o. female patient seen today in office with complaint of mildly painful thickened and elongated toenails.  Denies any pain to callus.  Reports that she is recovering from an allergic reaction related to pizza that she had a few days ago.  No other issues.   Patient Active Problem List   Diagnosis Date Noted   Subretinal hemorrhage of left eye 12/09/2018   Primary hyperparathyroidism ([place]) 04/10/2018   Restrictive lung disease 01/07/2018   Vitamin B12 deficiency 10/02/2017   Hematuria [id number]   Umbilical hernia without obstruction and without gangrene 04/18/2017   Other osteoporosis without current pathological fracture 04/17/2017   Advanced nonexudative age-related macular degeneration of right eye without subfoveal involvement 03/13/2017   Combined forms of age-related cataract of right eye 03/13/2017   PVD (posterior vitreous detachment), bilateral 03/13/2017   Chronic sinusitis 08/30/2016   Malaise and fatigue 08/30/2016   Calcaneal spur of left foot 06/11/2016   Plantar fat pad atrophy of left foot 06/11/2016   Left calcaneal bursitis 05/16/2016   Porokeratosis 05/16/2016   Venous insufficiency of both lower extremities 05/16/2016   Anticoagulant long-term use 02/28/2016   CAD in native artery 02/28/2016   CKD (chronic kidney disease) stage 3, GFR 30-59 ml/min (HCC) 02/28/2016   Gastroesophageal reflux disease without esophagitis 02/28/2016   High risk medication use 02/28/2016   Localized edema 02/28/2016   Malignant neoplasm of colon ([place]) 02/28/2016   Mixed hyperlipidemia 02/28/2016   Morbid obesity with BMI of 40.0-44.9, adult ([place]) 02/28/2016   Primary osteoarthritis involving multiple joints 02/28/2016   Vitamin D deficiency 02/28/2016   Moderate persistent asthma without complication [id number]   Atypical ductal hyperplasia of left breast 12/01/2013   Diabetes mellitus ([place]) 11/23/2013   Permanent atrial fibrillation ([place])  11/16/2013    Current Outpatient Medications on File Prior to Visit  Medication Sig Dispense Refill   albuterol (PROAIR HFA) 108 (90 Base) MCG/ACT inhaler Inhale into the lungs.     BREO ELLIPTA 100-25 MCG/INH AEPB      clindamycin (CLEOCIN) 300 MG capsule Take 300 mg by mouth 3 (three) times daily.     Cyanocobalamin (VITAMIN B-12 PO) Take by mouth.     enalapril (VASOTEC) 10 MG tablet 2 (two) times daily.      fluticasone (FLONASE) 50 MCG/ACT nasal spray Place 2 sprays into both nostrils daily. 15.8 mL 2   furosemide (LASIX) 40 MG tablet Take 40 mg by mouth 2 (two) times daily.      gentamicin ointment (GARAMYCIN) 0.1 % Apply 1 application topically 3 (three) times daily.     HYDROcodone-acetaminophen (NORCO) 7.5-325 MG tablet for pain  0   isosorbide mononitrate (IMDUR) 30 MG 24 hr tablet Take 30 mg by mouth daily.     metoprolol (LOPRESSOR) 50 MG tablet 2 (two) times daily. Pt takes she takes 1/2 bid     Multiple Vitamins-Minerals (PRESERVISION AREDS 2 PO) Take by mouth.     mupirocin ointment (BACTROBAN) 2 % Apply topically.     nitroGLYCERIN (NITRODUR - DOSED IN MG/24 HR) 0.4 mg/hr Place 1 patch onto the skin daily.      OMEPRAZOLE PO Take by mouth.     rosuvastatin (CRESTOR) 10 MG tablet Take 10 mg by mouth daily.     SPIRONOLACTONE PO Take by mouth.     tobramycin (TOBREX) 0.3 % ophthalmic ointment Place 1 application into the right eye 2 (two) times daily. 3.5 g 0   triamcinolone cream (  KENALOG) 0.1 % Apply 1 application topically 2 (two) times daily.     Vitamin D, Ergocalciferol, (DRISDOL) 1.25 MG (50000 UNIT) CAPS capsule TAKE 1 CAPSULE BY MOUTH ONCE WEEKLY 12 capsule 4   XARELTO 20 MG TABS Take 20 mg by mouth daily.      No current facility-administered medications on file prior to visit.    Allergies  Allergen Reactions   Alprazolam Palpitations    palpitations palpitations    Pseudoephedrine Palpitations   Sulfa Antibiotics Anaphylaxis    Pain in ears Pain in  ears   Nitrofurantoin Rash   Sulfamethoxazole-Trimethoprim Tinitus   Amoxicillin Other (See Comments)    Pain in legs    Ampicillin Other (See Comments)    Pain in legs    Cephalexin Other (See Comments)    Stomach get real raw   Clindamycin Swelling   Iodine Hives   Metrizamide Other (See Comments)    Skin burns   Nitrofurantoin Macrocrystal    Other     Pt states she is allergic to some drug but can't remember- she will call us with info.   Sulfasalazine Other (See Comments)    Pain in ears   Iodinated Diagnostic Agents Itching    Skin burns Skin burns    Prednisone Palpitations    Objective: Physical Exam  General: Well developed, nourished, no acute distress, awake, alert and oriented x 3  Vascular: Dorsalis pedis artery 1/4 bilateral, Posterior tibial artery 0/4 bilateral, skin temperature warm to warm proximal to distal bilateral lower extremities, ++ varicosities, trace edema to ankles bilateral with compression stocking in place, no pedal hair present bilateral. History of right leg ulcer treated by wound center.   Neurological: Gross sensation present via light touch bilateral.   Dermatological: Skin is warm, dry, and supple bilateral, Nails 1-10 are tender, long, thick, and discolored with mild subungal debris, no webspace macerations present bilateral, no open lesions present bilateral, minimal hyperkeratotic tissue present right lateral foot not symptomatic at this time. No signs of infection bilateral.  Musculoskeletal: Asymptomatic pes planus boney deformities noted bilateral. Muscular strength within normal limits without painon range of motion. No pain with calf compression bilateral.  Assessment and Plan:  Problem List Items Addressed This Visit   None Visit Diagnoses     Pain due to onychomycosis of toenails of both feet    -  Primary   Coagulation defect ([place])       PVD (peripheral vascular disease) ([place])          -Examined patient.   -Re-discussed treatment options for painful mycotic nails. -Mechanically debrided and reduced mycotic nails with sterile nail nipper and dremel nail file without incident. -Continue with compression garments for edema control as previous -Advised patient that if her allergic reaction does not get better to follow-up with PCP -Patient to return in 3 months for follow up evaluation or sooner if symptoms worsen.  Landis Martins, DPM

## 2021-07-04 ENCOUNTER — Telehealth: Payer: Self-pay

## 2021-07-06 ENCOUNTER — Inpatient Hospital Stay: Payer: Medicare Other | Admitting: Family

## 2021-07-06 ENCOUNTER — Other Ambulatory Visit: Payer: Self-pay | Admitting: Family

## 2021-07-06 ENCOUNTER — Inpatient Hospital Stay: Payer: Medicare Other

## 2021-07-06 DIAGNOSIS — N6092 Unspecified benign mammary dysplasia of left breast: Secondary | ICD-10-CM

## 2021-07-20 ENCOUNTER — Inpatient Hospital Stay: Payer: Medicare Other | Admitting: Hematology & Oncology

## 2021-07-20 ENCOUNTER — Telehealth: Payer: Self-pay

## 2021-07-20 ENCOUNTER — Inpatient Hospital Stay: Payer: Medicare Other

## 2021-07-20 NOTE — Telephone Encounter (Signed)
Pt was an hr late for appts and per desk nurse appts have to be r/s, pt declined appt for 9/9 and req October, done and printed  Katonya Blecher

## 2021-07-30 ENCOUNTER — Other Ambulatory Visit: Payer: Self-pay | Admitting: Hematology & Oncology

## 2021-09-03 ENCOUNTER — Encounter: Payer: Self-pay | Admitting: Hematology & Oncology

## 2021-09-03 ENCOUNTER — Inpatient Hospital Stay: Payer: Medicare Other | Attending: Hematology & Oncology

## 2021-09-03 ENCOUNTER — Inpatient Hospital Stay (HOSPITAL_BASED_OUTPATIENT_CLINIC_OR_DEPARTMENT_OTHER): Payer: Medicare Other | Admitting: Hematology & Oncology

## 2021-09-03 ENCOUNTER — Other Ambulatory Visit: Payer: Self-pay

## 2021-09-03 VITALS — BP 123/49 | HR 70 | Temp 98.3°F | Resp 20 | Wt 211.0 lb

## 2021-09-03 DIAGNOSIS — Z7901 Long term (current) use of anticoagulants: Secondary | ICD-10-CM | POA: Diagnosis not present

## 2021-09-03 DIAGNOSIS — M818 Other osteoporosis without current pathological fracture: Secondary | ICD-10-CM

## 2021-09-03 DIAGNOSIS — I482 Chronic atrial fibrillation, unspecified: Secondary | ICD-10-CM | POA: Diagnosis not present

## 2021-09-03 DIAGNOSIS — C182 Malignant neoplasm of ascending colon: Secondary | ICD-10-CM

## 2021-09-03 DIAGNOSIS — N6092 Unspecified benign mammary dysplasia of left breast: Secondary | ICD-10-CM | POA: Insufficient documentation

## 2021-09-03 DIAGNOSIS — H3530 Unspecified macular degeneration: Secondary | ICD-10-CM | POA: Diagnosis not present

## 2021-09-03 LAB — CBC WITH DIFFERENTIAL (CANCER CENTER ONLY)
Abs Immature Granulocytes: 0.02 10*3/uL (ref 0.00–0.07)
Basophils Absolute: 0.1 10*3/uL (ref 0.0–0.1)
Basophils Relative: 1 %
Eosinophils Absolute: 0.3 10*3/uL (ref 0.0–0.5)
Eosinophils Relative: 5 %
HCT: 39.2 % (ref 36.0–46.0)
Hemoglobin: 12.9 g/dL (ref 12.0–15.0)
Immature Granulocytes: 0 %
Lymphocytes Relative: 33 %
Lymphs Abs: 2.2 10*3/uL (ref 0.7–4.0)
MCH: 32.2 pg (ref 26.0–34.0)
MCHC: 32.9 g/dL (ref 30.0–36.0)
MCV: 97.8 fL (ref 80.0–100.0)
Monocytes Absolute: 1 10*3/uL (ref 0.1–1.0)
Monocytes Relative: 15 %
Neutro Abs: 3.1 10*3/uL (ref 1.7–7.7)
Neutrophils Relative %: 46 %
Platelet Count: 164 10*3/uL (ref 150–400)
RBC: 4.01 MIL/uL (ref 3.87–5.11)
RDW: 12.9 % (ref 11.5–15.5)
WBC Count: 6.6 10*3/uL (ref 4.0–10.5)
nRBC: 0 % (ref 0.0–0.2)

## 2021-09-03 LAB — CMP (CANCER CENTER ONLY)
ALT: 13 U/L (ref 0–44)
AST: 21 U/L (ref 15–41)
Albumin: 4.3 g/dL (ref 3.5–5.0)
Alkaline Phosphatase: 85 U/L (ref 38–126)
Anion gap: 8 (ref 5–15)
BUN: 28 mg/dL — ABNORMAL HIGH (ref 8–23)
CO2: 30 mmol/L (ref 22–32)
Calcium: 11.9 mg/dL — ABNORMAL HIGH (ref 8.9–10.3)
Chloride: 101 mmol/L (ref 98–111)
Creatinine: 1.01 mg/dL — ABNORMAL HIGH (ref 0.44–1.00)
GFR, Estimated: 56 mL/min — ABNORMAL LOW (ref >60)
Glucose, Bld: 108 mg/dL — ABNORMAL HIGH (ref 70–99)
Potassium: 4.2 mmol/L (ref 3.5–5.1)
Sodium: 139 mmol/L (ref 135–145)
Total Bilirubin: 0.9 mg/dL (ref 0.3–1.2)
Total Protein: 7.2 g/dL (ref 6.5–8.1)

## 2021-09-03 NOTE — Progress Notes (Signed)
Hematology and Oncology Follow Up Visit  Kathryn Steele [id number] 06/22/1941 80 y.o. 09/03/2021   Principle Diagnosis:  Atypical ductal hyperplasia of the left breast Chronic atrial fibrillation  Current Therapy:   Aromasin 25 mg by mouth daily -- d/c on 04/17/2018 Xarelto 20 mg by mouth daily     Interim History:  Ms.  Kathryn Steele is back for follow-up. It is been a little bit over a year since we last saw her.  Her main problem has been the macular degeneration.  She is still has this.  She is getting injections for this.  Her vision is slowly leaving her.  She does have the atrial fibrillation.  She is on Xarelto.  She is on chronic few medications for her cardiac rhythm.  I think she had a mammogram recently.  Everything looked okay with the mammogram.  She has had a good appetite.  She is trying to watch what she eats.  There is no nausea or vomiting.    She has had some bowel issues.  She says that she had a CT scan done.  I am unsure where she had this done.  She probably needs to have a colonoscopy.  I realize that she does have some health issues.  However, I think that the benefit of a colonoscopy certainly outweighs the risk.  \  Overall, I would say performance status is probably ECOG 2.    Medications:  Current Outpatient Medications:    BREO ELLIPTA 100-25 MCG/INH AEPB, daily., Disp: , Rfl:    Cyanocobalamin (VITAMIN B-12 PO), Take by mouth daily., Disp: , Rfl:    enalapril (VASOTEC) 10 MG tablet, 2 (two) times daily. , Disp: , Rfl:    furosemide (LASIX) 40 MG tablet, Take 40 mg by mouth daily., Disp: , Rfl:    gentamicin ointment (GARAMYCIN) 0.1 %, Apply 1 application topically 3 (three) times daily., Disp: , Rfl:    HYDROcodone-acetaminophen (NORCO) 7.5-325 MG tablet, 1-2 tablets every 6 (six) hours as needed. for pain, Disp: , Rfl: 0   isosorbide mononitrate (IMDUR) 30 MG 24 hr tablet, Take 30 mg by mouth 2 (two) times daily., Disp: , Rfl:    metoprolol  (LOPRESSOR) 50 MG tablet, 2 (two) times daily. Pt takes she takes 1/2 bid, Disp: , Rfl:    Multiple Vitamins-Minerals (PRESERVISION AREDS 2 PO), Take by mouth., Disp: , Rfl:    mupirocin ointment (BACTROBAN) 2 %, Apply topically., Disp: , Rfl:    OMEPRAZOLE PO, Take by mouth daily., Disp: , Rfl:    rosuvastatin (CRESTOR) 10 MG tablet, Take 10 mg by mouth daily., Disp: , Rfl:    SPIRONOLACTONE PO, Take by mouth., Disp: , Rfl:    triamcinolone cream (KENALOG) 0.1 %, Apply 1 application topically 2 (two) times daily., Disp: , Rfl:    Vitamin D, Ergocalciferol, (DRISDOL) 1.25 MG (50000 UNIT) CAPS capsule, TAKE 1 CAPSULE BY MOUTH ONCE WEEKLY (Patient taking differently: 50,000 Units every 14 (fourteen) days.), Disp: 12 capsule, Rfl: 4   XARELTO 20 MG TABS, Take 20 mg by mouth daily. , Disp: , Rfl:    albuterol (VENTOLIN HFA) 108 (90 Base) MCG/ACT inhaler, Inhale into the lungs. (Patient not taking: Reported on 09/03/2021), Disp: , Rfl:    fluticasone (FLONASE) 50 MCG/ACT nasal spray, Place 2 sprays into both nostrils daily. (Patient not taking: Reported on 09/03/2021), Disp: 15.8 mL, Rfl: 2   nitroGLYCERIN (NITRODUR - DOSED IN MG/24 HR) 0.4 mg/hr, Place 1 patch onto the skin daily.  (  Patient not taking: Reported on 09/03/2021), Disp: , Rfl:    tobramycin (TOBREX) 0.3 % ophthalmic ointment, Place 1 application into the right eye 2 (two) times daily. (Patient not taking: Reported on 09/03/2021), Disp: 3.5 g, Rfl: 0  Allergies:  Allergies  Allergen Reactions   Alprazolam Palpitations    palpitations palpitations    Pseudoephedrine Palpitations   Sulfa Antibiotics Anaphylaxis    Pain in ears Pain in ears   Nitrofurantoin Rash   Sulfamethoxazole-Trimethoprim Tinitus   Amoxicillin Other (See Comments)    Pain in legs    Ampicillin Other (See Comments)    Pain in legs    Cephalexin Other (See Comments)    Stomach get real raw   Clindamycin Swelling   Iodine Hives   Metrizamide Other (See  Comments)    Skin burns   Nitrofurantoin Macrocrystal    Other     Pt states she is allergic to some drug but can't remember- she will call us with info.   Sulfasalazine Other (See Comments)    Pain in ears   Iodinated Diagnostic Agents Itching    Skin burns Skin burns    Prednisone Palpitations    Past Medical History, Surgical history, Social history, and Family History were reviewed and updated.  Review of Systems: Review of Systems  Constitutional: Negative.   HENT: Negative.    Eyes:  Positive for blurred vision.  Respiratory: Negative.    Cardiovascular:  Positive for palpitations and leg swelling.  Gastrointestinal: Negative.   Genitourinary: Negative.   Musculoskeletal:  Positive for myalgias.  Skin: Negative.   Neurological: Negative.   Endo/Heme/Allergies: Negative.   Psychiatric/Behavioral: Negative.      Physical Exam:  weight is 211 lb (95.7 kg). Her oral temperature is 98.3 F (36.8 C). Her blood pressure is 123/49 (abnormal) and her pulse is 70. Her respiration is 20 and oxygen saturation is 98%.   Physical Exam Vitals reviewed.  HENT:     Head: Normocephalic and atraumatic.  Eyes:     Pupils: Pupils are equal, round, and reactive to light.  Cardiovascular:     Rate and Rhythm: Normal rate and regular rhythm.     Heart sounds: Normal heart sounds.  Pulmonary:     Effort: Pulmonary effort is normal.     Breath sounds: Normal breath sounds.  Abdominal:     General: Bowel sounds are normal.     Palpations: Abdomen is soft.  Musculoskeletal:        General: No tenderness or deformity. Normal range of motion.     Cervical back: Normal range of motion.  Lymphadenopathy:     Cervical: No cervical adenopathy.  Skin:    General: Skin is warm and dry.     Findings: No erythema or rash.  Neurological:     Mental Status: She is alert and oriented to person, place, and time.  Psychiatric:        Behavior: Behavior normal.        Thought Content:  Thought content normal.        Judgment: Judgment normal.     Lab Results  Component Value Date   WBC 6.6 09/03/2021   HGB 12.9 09/03/2021   HCT 39.2 09/03/2021   MCV 97.8 09/03/2021   PLT 164 09/03/2021     Chemistry      Component Value Date/Time   NA 139 09/03/2021 1008   NA 142 04/17/2017 1010   K 4.2 09/03/2021 1008   K 4.5  04/17/2017 1010   CL 101 09/03/2021 1008   CO2 30 09/03/2021 1008   CO2 28 04/17/2017 1010   BUN 28 (H) 09/03/2021 1008   BUN 13.9 04/17/2017 1010   CREATININE 1.01 (H) 09/03/2021 1008   CREATININE 1.0 04/17/2017 1010      Component Value Date/Time   CALCIUM 11.9 (H) 09/03/2021 1008   CALCIUM 10.7 (H) 04/17/2017 1010   ALKPHOS 85 09/03/2021 1008   ALKPHOS 110 04/17/2017 1010   AST 21 09/03/2021 1008   AST 17 04/17/2017 1010   ALT 13 09/03/2021 1008   ALT 11 04/17/2017 1010   BILITOT 0.9 09/03/2021 1008   BILITOT 0.59 04/17/2017 1010      Impression and Plan: Ms. Lineberry is 80 year old white female. She has atypical ductal hyperplasia left breast. We have been following this now for over 7 years. There's been no evidence of progression to malignancy.  I have that she has a macular degeneration.  Hopefully, this will stabilize.  Again, I do not think that there should be a problem, from my point of view, with her having a colonoscopy.  She is having these bowel issues which seem to be causing her some distress.  We will still get her back yearly.  She like to come back to see [place].  She just feels confident coming to our office and knowing that we will do a fairly thorough evaluation.  Volanda Napoleon, MD 10/17/202211:17 AM

## 2021-09-05 DIAGNOSIS — L821 Other seborrheic keratosis: Secondary | ICD-10-CM | POA: Insufficient documentation

## 2021-10-05 ENCOUNTER — Other Ambulatory Visit: Payer: Self-pay

## 2021-10-05 ENCOUNTER — Ambulatory Visit (INDEPENDENT_AMBULATORY_CARE_PROVIDER_SITE_OTHER): Payer: Medicare Other | Admitting: Sports Medicine

## 2021-10-05 ENCOUNTER — Encounter: Payer: Self-pay | Admitting: Sports Medicine

## 2021-10-05 DIAGNOSIS — M79675 Pain in left toe(s): Secondary | ICD-10-CM | POA: Diagnosis not present

## 2021-10-05 DIAGNOSIS — B351 Tinea unguium: Secondary | ICD-10-CM | POA: Diagnosis not present

## 2021-10-05 DIAGNOSIS — I739 Peripheral vascular disease, unspecified: Secondary | ICD-10-CM

## 2021-10-05 DIAGNOSIS — M7751 Other enthesopathy of right foot: Secondary | ICD-10-CM | POA: Diagnosis not present

## 2021-10-05 DIAGNOSIS — D689 Coagulation defect, unspecified: Secondary | ICD-10-CM

## 2021-10-05 DIAGNOSIS — M79674 Pain in right toe(s): Secondary | ICD-10-CM | POA: Diagnosis not present

## 2021-10-05 DIAGNOSIS — M79671 Pain in right foot: Secondary | ICD-10-CM

## 2021-10-05 DIAGNOSIS — Q828 Other specified congenital malformations of skin: Secondary | ICD-10-CM

## 2021-10-05 DIAGNOSIS — L84 Corns and callosities: Secondary | ICD-10-CM

## 2021-10-05 DIAGNOSIS — G8929 Other chronic pain: Secondary | ICD-10-CM

## 2021-10-05 MED ORDER — TRIAMCINOLONE ACETONIDE 10 MG/ML IJ SUSP
10.0000 mg | Freq: Once | INTRAMUSCULAR | Status: AC
  Administered 2021-10-05: 10 mg

## 2021-10-05 NOTE — Progress Notes (Signed)
Subjective: Kathryn Steele is a 80 y.o. female patient seen today in office with complaint of mildly painful thickened and elongated toenails and pain to right foot callus and to the heel that started a few days ago. Feels better since she started wearing her gel heel cups but when she tried her orthotics it made her knees hurt.  No other issues.   Patient Active Problem List   Diagnosis Date Noted   Subretinal hemorrhage of left eye 12/09/2018   Primary hyperparathyroidism ([place]) 04/10/2018   Restrictive lung disease 01/07/2018   Vitamin B12 deficiency 10/02/2017   Hematuria 82/99/3716   Umbilical hernia without obstruction and without gangrene 04/18/2017   Other osteoporosis without current pathological fracture 04/17/2017   Advanced nonexudative age-related macular degeneration of right eye without subfoveal involvement 03/13/2017   Combined forms of age-related cataract of right eye 03/13/2017   PVD (posterior vitreous detachment), bilateral 03/13/2017   Chronic sinusitis 08/30/2016   Malaise and fatigue 08/30/2016   Calcaneal spur of left foot 06/11/2016   Plantar fat pad atrophy of left foot 06/11/2016   Left calcaneal bursitis 05/16/2016   Porokeratosis 05/16/2016   Venous insufficiency of both lower extremities 05/16/2016   Anticoagulant long-term use 02/28/2016   CAD in native artery 02/28/2016   CKD (chronic kidney disease) stage 3, GFR 30-59 ml/min (HCC) 02/28/2016   Gastroesophageal reflux disease without esophagitis 02/28/2016   High risk medication use 02/28/2016   Localized edema 02/28/2016   Malignant neoplasm of colon ([place]) 02/28/2016   Mixed hyperlipidemia 02/28/2016   Morbid obesity with BMI of 40.0-44.9, adult ([place]) 02/28/2016   Primary osteoarthritis involving multiple joints 02/28/2016   Vitamin D deficiency 02/28/2016   Moderate persistent asthma without complication 96/78/9381   Atypical ductal hyperplasia of left breast 12/01/2013   Diabetes mellitus  ([place]) 11/23/2013   Permanent atrial fibrillation ([place]) 11/16/2013    Current Outpatient Medications on File Prior to Visit  Medication Sig Dispense Refill   albuterol (VENTOLIN HFA) 108 (90 Base) MCG/ACT inhaler Inhale into the lungs. (Patient not taking: Reported on 09/03/2021)     BREO ELLIPTA 100-25 MCG/INH AEPB daily.     Cyanocobalamin (VITAMIN B-12 PO) Take by mouth daily.     enalapril (VASOTEC) 10 MG tablet 2 (two) times daily.      fluticasone (FLONASE) 50 MCG/ACT nasal spray Place 2 sprays into both nostrils daily. (Patient not taking: Reported on 09/03/2021) 15.8 mL 2   furosemide (LASIX) 40 MG tablet Take 40 mg by mouth daily.     gentamicin ointment (GARAMYCIN) 0.1 % Apply 1 application topically 3 (three) times daily.     HYDROcodone-acetaminophen (NORCO) 7.5-325 MG tablet 1-2 tablets every 6 (six) hours as needed. for pain  0   isosorbide mononitrate (IMDUR) 30 MG 24 hr tablet Take 30 mg by mouth 2 (two) times daily.     metoprolol (LOPRESSOR) 50 MG tablet 2 (two) times daily. Pt takes she takes 1/2 bid     Multiple Vitamins-Minerals (PRESERVISION AREDS 2 PO) Take by mouth.     mupirocin ointment (BACTROBAN) 2 % Apply topically.     nitroGLYCERIN (NITRODUR - DOSED IN MG/24 HR) 0.4 mg/hr Place 1 patch onto the skin daily.  (Patient not taking: Reported on 09/03/2021)     OMEPRAZOLE PO Take by mouth daily.     rosuvastatin (CRESTOR) 10 MG tablet Take 10 mg by mouth daily.     SPIRONOLACTONE PO Take by mouth.     tobramycin (TOBREX) 0.3 %  ophthalmic ointment Place 1 application into the right eye 2 (two) times daily. (Patient not taking: Reported on 09/03/2021) 3.5 g 0   triamcinolone cream (KENALOG) 0.1 % Apply 1 application topically 2 (two) times daily.     Vitamin D, Ergocalciferol, (DRISDOL) 1.25 MG (50000 UNIT) CAPS capsule TAKE 1 CAPSULE BY MOUTH ONCE WEEKLY (Patient taking differently: 50,000 Units every 14 (fourteen) days.) 12 capsule 4   XARELTO 20 MG TABS Take 20 mg by  mouth daily.      No current facility-administered medications on file prior to visit.    Allergies  Allergen Reactions   Alprazolam Palpitations    palpitations palpitations    Pseudoephedrine Palpitations   Sulfa Antibiotics Anaphylaxis    Pain in ears Pain in ears   Nitrofurantoin Rash   Sulfamethoxazole-Trimethoprim Tinitus   Amoxicillin Other (See Comments)    Pain in legs    Ampicillin Other (See Comments)    Pain in legs    Cephalexin Other (See Comments)    Stomach get real raw   Clindamycin Swelling   Iodine Hives   Metrizamide Other (See Comments)    Skin burns   Nitrofurantoin Macrocrystal    Other     Pt states she is allergic to some drug but can't remember- she will call us with info.   Sulfasalazine Other (See Comments)    Pain in ears   Iodinated Diagnostic Agents Itching    Skin burns Skin burns    Prednisone Palpitations    Objective: Physical Exam  General: Well developed, nourished, no acute distress, awake, alert and oriented x 3  Vascular: Dorsalis pedis artery 1/4 bilateral, Posterior tibial artery 0/4 bilateral, skin temperature warm to warm proximal to distal bilateral lower extremities, ++ varicosities, trace edema to ankles bilateral with compression stocking in place, no pedal hair present bilateral. History of right leg ulcer treated by wound center.   Neurological: Gross sensation present via light touch bilateral.   Dermatological: Skin is warm, dry, and supple bilateral, Nails 1-10 are tender, long, thick, and discolored with mild subungal debris, no webspace macerations present bilateral, no open lesions present bilateral, minimal hyperkeratotic tissue present right lateral foot  with nucleated core. No signs of infection bilateral.  Musculoskeletal: + pain at plantar central and medial heel at the plantar fascial insertion on the right foot.  Patient has a history of bone spur. Asymptomatic pes planus boney deformities noted  bilateral. Muscular strength within normal limits for patient age.   Assessment and Plan:  Problem List Items Addressed This Visit       Musculoskeletal and Integument   Porokeratosis   Other Visit Diagnoses     Pain due to onychomycosis of toenails of both feet    -  Primary   Callus       Chronic heel pain, right       Relevant Medications   triamcinolone acetonide (KENALOG) 10 MG/ML injection 10 mg (Completed)   Bursitis of heel, right       Relevant Medications   triamcinolone acetonide (KENALOG) 10 MG/ML injection 10 mg (Completed)   Right foot pain       Coagulation defect (HCC)       PVD (peripheral vascular disease) ([place])          -Examined patient.  -Re-discussed treatment options for painful mycotic nails. -Mechanically debrided and reduced mycotic nails with sterile nail nipper and dremel nail file without incident. -Discussed treatment options for painful porokeratosis on right -  Mechanically debrided keratosis x 1 using a Sterile chisel blade without incident: Applied Salinocaine to the area covered with bandaid and advised to remove bandaid later today -Discussed treatment options for right heel pain; patient has tolerated steroid injection in the past with no adverse reaction -After oral consent and aseptic prep, injected a mixture containing 1 ml of 2%  plain lidocaine, 1 ml 0.5% plain marcaine, 0.5 ml of kenalog 10 and 0.5 ml of dexamethasone phosphate into right plantar heel without complication. Post-injection care discussed with patient.  -Dispensed heel lifts for patient to use in shoes where her heel cups do not fit -Patient to return in 2.5-3 months for follow up evaluation or sooner if symptoms worsen.  Landis Martins, DPM

## 2021-12-18 ENCOUNTER — Ambulatory Visit (INDEPENDENT_AMBULATORY_CARE_PROVIDER_SITE_OTHER): Payer: Medicare Other | Admitting: Sports Medicine

## 2021-12-18 DIAGNOSIS — B351 Tinea unguium: Secondary | ICD-10-CM | POA: Diagnosis not present

## 2021-12-18 DIAGNOSIS — G8929 Other chronic pain: Secondary | ICD-10-CM

## 2021-12-18 DIAGNOSIS — Q828 Other specified congenital malformations of skin: Secondary | ICD-10-CM

## 2021-12-18 DIAGNOSIS — M79674 Pain in right toe(s): Secondary | ICD-10-CM | POA: Diagnosis not present

## 2021-12-18 DIAGNOSIS — M79675 Pain in left toe(s): Secondary | ICD-10-CM

## 2021-12-18 DIAGNOSIS — D689 Coagulation defect, unspecified: Secondary | ICD-10-CM

## 2021-12-18 DIAGNOSIS — L84 Corns and callosities: Secondary | ICD-10-CM

## 2021-12-18 DIAGNOSIS — I739 Peripheral vascular disease, unspecified: Secondary | ICD-10-CM

## 2021-12-18 DIAGNOSIS — M79671 Pain in right foot: Secondary | ICD-10-CM

## 2021-12-19 ENCOUNTER — Encounter: Payer: Self-pay | Admitting: Sports Medicine

## 2021-12-19 NOTE — Progress Notes (Signed)
Subjective: Kathryn Steele is a 81 y.o. female patient seen today in office with complaint of mildly painful thickened and elongated toenails and callus skin to the right foot. States that the last injection in her heel did not help as much as she would have hoped but heel cushions help.   Dr. Bea Graff- Nov. 2022  Pre-diabetic not on any meds  Patient Active Problem List   Diagnosis Date Noted   Subretinal hemorrhage of left eye 12/09/2018   Primary hyperparathyroidism ([place]) 04/10/2018   Restrictive lung disease 01/07/2018   Vitamin B12 deficiency 10/02/2017   Hematuria 56/43/3295   Umbilical hernia without obstruction and without gangrene 04/18/2017   Other osteoporosis without current pathological fracture 04/17/2017   Advanced nonexudative age-related macular degeneration of right eye without subfoveal involvement 03/13/2017   Combined forms of age-related cataract of right eye 03/13/2017   PVD (posterior vitreous detachment), bilateral 03/13/2017   Chronic sinusitis 08/30/2016   Malaise and fatigue 08/30/2016   Calcaneal spur of left foot 06/11/2016   Plantar fat pad atrophy of left foot 06/11/2016   Left calcaneal bursitis 05/16/2016   Porokeratosis 05/16/2016   Venous insufficiency of both lower extremities 05/16/2016   Anticoagulant long-term use 02/28/2016   CAD in native artery 02/28/2016   CKD (chronic kidney disease) stage 3, GFR 30-59 ml/min (HCC) 02/28/2016   Gastroesophageal reflux disease without esophagitis 02/28/2016   High risk medication use 02/28/2016   Localized edema 02/28/2016   Malignant neoplasm of colon ([place]) 02/28/2016   Mixed hyperlipidemia 02/28/2016   Morbid obesity with BMI of 40.0-44.9, adult ([place]) 02/28/2016   Primary osteoarthritis involving multiple joints 02/28/2016   Vitamin D deficiency 02/28/2016   Moderate persistent asthma without complication 18/84/1660   Atypical ductal hyperplasia of left breast 12/01/2013   Diabetes mellitus ([place])  11/23/2013   Permanent atrial fibrillation ([place]) 11/16/2013    Current Outpatient Medications on File Prior to Visit  Medication Sig Dispense Refill   albuterol (VENTOLIN HFA) 108 (90 Base) MCG/ACT inhaler Inhale into the lungs. (Patient not taking: Reported on 09/03/2021)     BREO ELLIPTA 100-25 MCG/INH AEPB daily.     Cyanocobalamin (VITAMIN B-12 PO) Take by mouth daily.     enalapril (VASOTEC) 10 MG tablet 2 (two) times daily.      fluticasone (FLONASE) 50 MCG/ACT nasal spray Place 2 sprays into both nostrils daily. (Patient not taking: Reported on 09/03/2021) 15.8 mL 2   furosemide (LASIX) 40 MG tablet Take 40 mg by mouth daily.     gentamicin ointment (GARAMYCIN) 0.1 % Apply 1 application topically 3 (three) times daily.     HYDROcodone-acetaminophen (NORCO) 7.5-325 MG tablet 1-2 tablets every 6 (six) hours as needed. for pain  0   isosorbide mononitrate (IMDUR) 30 MG 24 hr tablet Take 30 mg by mouth 2 (two) times daily.     metoprolol (LOPRESSOR) 50 MG tablet 2 (two) times daily. Pt takes she takes 1/2 bid     Multiple Vitamins-Minerals (PRESERVISION AREDS 2 PO) Take by mouth.     mupirocin ointment (BACTROBAN) 2 % Apply topically.     nitroGLYCERIN (NITRODUR - DOSED IN MG/24 HR) 0.4 mg/hr Place 1 patch onto the skin daily.  (Patient not taking: Reported on 09/03/2021)     OMEPRAZOLE PO Take by mouth daily.     rosuvastatin (CRESTOR) 10 MG tablet Take 10 mg by mouth daily.     SPIRONOLACTONE PO Take by mouth.     tobramycin (TOBREX) 0.3 % ophthalmic  ointment Place 1 application into the right eye 2 (two) times daily. (Patient not taking: Reported on 09/03/2021) 3.5 g 0   triamcinolone cream (KENALOG) 0.1 % Apply 1 application topically 2 (two) times daily.     Vitamin D, Ergocalciferol, (DRISDOL) 1.25 MG (50000 UNIT) CAPS capsule TAKE 1 CAPSULE BY MOUTH ONCE WEEKLY (Patient taking differently: 50,000 Units every 14 (fourteen) days.) 12 capsule 4   XARELTO 20 MG TABS Take 20 mg by mouth  daily.      No current facility-administered medications on file prior to visit.    Allergies  Allergen Reactions   Alprazolam Palpitations    palpitations palpitations    Pseudoephedrine Palpitations   Sulfa Antibiotics Anaphylaxis    Pain in ears Pain in ears   Nitrofurantoin Rash   Sulfamethoxazole-Trimethoprim Tinitus   Amoxicillin Other (See Comments)    Pain in legs    Ampicillin Other (See Comments)    Pain in legs    Cephalexin Other (See Comments)    Stomach get real raw   Clindamycin Swelling   Iodine Hives   Metrizamide Other (See Comments)    Skin burns   Nitrofurantoin Macrocrystal    Other     Pt states she is allergic to some drug but can't remember- she will call us with info.   Sulfasalazine Other (See Comments)    Pain in ears   Iodinated Contrast Media Itching    Skin burns Skin burns    Prednisone Palpitations    Objective: Physical Exam  General: Well developed, nourished, no acute distress, awake, alert and oriented x 3  Vascular: Dorsalis pedis artery 1/4 bilateral, Posterior tibial artery 0/4 bilateral, skin temperature warm to warm proximal to distal bilateral lower extremities, ++ varicosities, trace edema to ankles bilateral with compression stocking in place, no pedal hair present bilateral.   Neurological: Gross sensation present via light touch bilateral.   Dermatological: Skin is warm, dry, and supple bilateral, Nails 1-10 are tender, long, thick, and discolored with mild subungal debris, no webspace macerations present bilateral, no open lesions present bilateral, minimal hyperkeratotic tissue present right lateral foot with nucleated core and keratotic lesion plantar right heel with nucleated core. No signs of infection bilateral.  Musculoskeletal: + pain at plantar central and medial heel at the plantar fascial insertion on the right foot, chronic in nature.  Patient has a history of bone spur. Asymptomatic pes planus boney  deformities noted bilateral. Muscular strength within normal limits for patient age.   Assessment and Plan:  Problem List Items Addressed This Visit       Musculoskeletal and Integument   Porokeratosis   Other Visit Diagnoses     Pain due to onychomycosis of toenails of both feet    -  Primary   Callus       Chronic heel pain, right       Coagulation defect ([place])       PVD (peripheral vascular disease) ([place])          -Examined patient.  -Re-discussed treatment options for painful mycotic nails. -Mechanically debrided and reduced mycotic nails with sterile nail nipper and dremel nail file without incident. -Discussed treatment options for painful porokeratosis on right -Mechanically debrided keratosis x 2 using a Sterile chisel blade without incident: Applied Salinocaine to the area covered with bandaid and advised to remove bandaid after shower -Discussed treatment options for right heel paint -Recommend daily icing; ice pack provided.  -Continue with heel lifts bilateral and good  supportive shoes  -Patient to return in 2.5-3 months for follow up evaluation or sooner if symptoms worsen.  Landis Martins, DPM

## 2022-01-24 DIAGNOSIS — H2513 Age-related nuclear cataract, bilateral: Secondary | ICD-10-CM | POA: Insufficient documentation

## 2022-03-15 ENCOUNTER — Ambulatory Visit (INDEPENDENT_AMBULATORY_CARE_PROVIDER_SITE_OTHER): Payer: Medicare Other | Admitting: Sports Medicine

## 2022-03-15 ENCOUNTER — Encounter: Payer: Self-pay | Admitting: Sports Medicine

## 2022-03-15 DIAGNOSIS — B351 Tinea unguium: Secondary | ICD-10-CM

## 2022-03-15 DIAGNOSIS — M79674 Pain in right toe(s): Secondary | ICD-10-CM | POA: Diagnosis not present

## 2022-03-15 DIAGNOSIS — I739 Peripheral vascular disease, unspecified: Secondary | ICD-10-CM

## 2022-03-15 DIAGNOSIS — L84 Corns and callosities: Secondary | ICD-10-CM

## 2022-03-15 DIAGNOSIS — G629 Polyneuropathy, unspecified: Secondary | ICD-10-CM

## 2022-03-15 DIAGNOSIS — M79675 Pain in left toe(s): Secondary | ICD-10-CM | POA: Diagnosis not present

## 2022-03-15 DIAGNOSIS — D689 Coagulation defect, unspecified: Secondary | ICD-10-CM

## 2022-03-15 DIAGNOSIS — Q828 Other specified congenital malformations of skin: Secondary | ICD-10-CM

## 2022-03-15 DIAGNOSIS — I5032 Chronic diastolic (congestive) heart failure: Secondary | ICD-10-CM | POA: Insufficient documentation

## 2022-03-15 NOTE — Patient Instructions (Addendum)
Nervive nerve relief PM supplement for your nerves can be purchased OTC at [facility] ? ?

## 2022-03-15 NOTE — Progress Notes (Signed)
Subjective: ?Kathryn Steele is a 81 y.o. female patient seen today in office with complaint of mildly painful thickened and elongated toenails and callus skin to the right foot and now left heel.  Reports that she gets some tingling and sharp shooting pain at night knows that she has a history of neuropathy and is wondering what else he can do to help.  Reports that she is recovering from cataract surgery ? ?Dr. Bea Graff- Nov. 2022 ? ?Pre-diabetic not on any meds as previously noted history of vitamin B12 deficiency and neuropathy as well as PVD. ? ?Patient Active Problem List  ? Diagnosis Date Noted  ? Chronic heart failure with preserved ejection fraction (HFpEF) ([place]) 03/15/2022  ? Age-related nuclear cataract of both eyes 01/24/2022  ? Seborrheic keratosis 09/05/2021  ? Chronic idiopathic constipation 03/06/2021  ? Other specified abdominal hernia without obstruction or gangrene 01/18/2021  ? Chalazion of right upper eyelid 06/22/2020  ? Subretinal hemorrhage of left eye 12/09/2018  ? Primary hyperparathyroidism ([place]) 04/10/2018  ? Restrictive lung disease 01/07/2018  ? Vitamin B12 deficiency 10/02/2017  ? Hematuria 04/18/2017  ? Umbilical hernia without obstruction and without gangrene 04/18/2017  ? Other osteoporosis without current pathological fracture 04/17/2017  ? Advanced nonexudative age-related macular degeneration of right eye without subfoveal involvement 03/13/2017  ? Combined forms of age-related cataract of right eye 03/13/2017  ? PVD (posterior vitreous detachment), bilateral 03/13/2017  ? Chronic sinusitis 08/30/2016  ? Malaise and fatigue 08/30/2016  ? Calcaneal spur of left foot 06/11/2016  ? Plantar fat pad atrophy of left foot 06/11/2016  ? Left calcaneal bursitis 05/16/2016  ? Porokeratosis 05/16/2016  ? Venous insufficiency of both lower extremities 05/16/2016  ? Anticoagulant long-term use 02/28/2016  ? CAD in native artery 02/28/2016  ? CKD (chronic kidney disease) stage 3, GFR 30-59  ml/min (HCC) 02/28/2016  ? Gastroesophageal reflux disease without esophagitis 02/28/2016  ? High risk medication use 02/28/2016  ? Localized edema 02/28/2016  ? Malignant neoplasm of colon ([place]) 02/28/2016  ? Mixed hyperlipidemia 02/28/2016  ? Morbid obesity with BMI of 40.0-44.9, adult ([place]) 02/28/2016  ? Primary osteoarthritis involving multiple joints 02/28/2016  ? Vitamin D deficiency 02/28/2016  ? History of colon cancer 02/28/2016  ? Moderate persistent asthma without complication 27/01/5008  ? Shortness of breath 11/23/2014  ? Atypical ductal hyperplasia of left breast 12/01/2013  ? Diabetes mellitus ([place]) 11/23/2013  ? Prediabetes 11/23/2013  ? Permanent atrial fibrillation ([place]) 11/16/2013  ? ? ?Current Outpatient Medications on File Prior to Visit  ?Medication Sig Dispense Refill  ? fluticasone (FLONASE) 50 MCG/ACT nasal spray Administer 2 sprays in each nostril daily as needed for Allergies.    ? BREO ELLIPTA 100-25 MCG/INH AEPB daily.    ? Cyanocobalamin 5000 MCG CAPS Take 1 capsule by mouth daily.    ? enalapril (VASOTEC) 10 MG tablet 2 (two) times daily.     ? furosemide (LASIX) 40 MG tablet Take 40 mg by mouth daily.    ? gentamicin ointment (GARAMYCIN) 0.1 % Apply 1 application topically 3 (three) times daily.    ? HYDROcodone-acetaminophen (NORCO) 7.5-325 MG tablet 1-2 tablets every 6 (six) hours as needed. for pain  0  ? isosorbide mononitrate (IMDUR) 30 MG 24 hr tablet Take 30 mg by mouth 2 (two) times daily.    ? metoprolol (LOPRESSOR) 50 MG tablet 2 (two) times daily. Pt takes she takes 1/2 bid    ? Multiple Vitamins-Minerals (PRESERVISION AREDS 2 PO) Take by mouth.    ?  mupirocin ointment (BACTROBAN) 2 % Apply topically.    ? OMEPRAZOLE PO Take by mouth daily.    ? rosuvastatin (CRESTOR) 10 MG tablet Take 10 mg by mouth daily.    ? SPIRONOLACTONE PO Take by mouth.    ? triamcinolone cream (KENALOG) 0.1 % Apply 1 application topically 2 (two) times daily.    ? Vitamin D, Ergocalciferol,  (DRISDOL) 1.25 MG (50000 UNIT) CAPS capsule TAKE 1 CAPSULE BY MOUTH ONCE WEEKLY (Patient taking differently: 50,000 Units every 14 (fourteen) days.) 12 capsule 4  ? XARELTO 20 MG TABS Take 20 mg by mouth daily.     ? ?No current facility-administered medications on file prior to visit.  ? ? ?Allergies  ?Allergen Reactions  ? Alprazolam Palpitations  ?  palpitations ?palpitations ?  ? Pseudoephedrine Palpitations  ? Sulfa Antibiotics Anaphylaxis  ?  Pain in ears ?Pain in ears  ? Nitrofurantoin Rash  ? Sulfamethoxazole-Trimethoprim Tinitus  ? Amoxicillin Other (See Comments)  ?  Pain in legs ?  ? Ampicillin Other (See Comments)  ?  Pain in legs ?  ? Cephalexin Other (See Comments)  ?  Stomach get real raw  ? Clindamycin Swelling  ? Iodine Hives  ? Metrizamide Other (See Comments)  ?  Skin burns  ? Nitrofurantoin Macrocrystal   ? Other   ?  Pt states she is allergic to some drug but can't remember- she will call us with info.  ? Sulfasalazine Other (See Comments)  ?  Pain in ears  ? Iodinated Contrast Media Itching  ?  Skin burns ?Skin burns ?  ? Prednisone Palpitations  ? ? ?Objective: ?Physical Exam ? ?General: Well developed, nourished, no acute distress, awake, alert and oriented x 3 ? ?Vascular: Dorsalis pedis artery 1/4 bilateral, Posterior tibial artery 0/4 bilateral, skin temperature warm to warm proximal to distal bilateral lower extremities, ++ varicosities, trace edema to ankles bilateral with compression stocking in place, no pedal hair present bilateral.  ? ?Neurological: Gross sensation present via light touch bilateral.  Subjective sharp shooting pains of both feet consistent with history of neuropathy. ? ?Dermatological: Skin is warm, dry, and supple bilateral, Nails 1-10 are tender, long, thick, and discolored with mild subungal debris, no webspace macerations present bilateral, no open lesions present bilateral, minimal hyperkeratotic tissue present right lateral foot with nucleated core and keratotic  lesion plantar left and right heel with nucleated core. No signs of infection bilateral. ? ?Musculoskeletal: No significant pain to palpation to both feet.  Asymptomatic pes planus boney deformities noted bilateral. Muscular strength within normal limits for patient age.  ? ?Assessment and Plan:  ?Problem List Items Addressed This Visit   ? ?  ? Musculoskeletal and Integument  ? Porokeratosis  ? ?Other Visit Diagnoses   ? ? Pain due to onychomycosis of toenails of both feet    -  Primary  ? Callus      ? Coagulation defect ([place])      ? PVD (peripheral vascular disease) ([place])      ? Neuropathy      ? ?  ? ?-Examined patient.  ?-Re-discussed treatment options for painful mycotic nails. ?-Mechanically debrided and reduced all painful mycotic nails with sterile nail nipper and dremel nail file without incident. ?-Discussed treatment options for painful porokeratosis on right ?-Mechanically debrided keratosis x 3 using a Sterile chisel blade without incident. ?-Recommend to continue with good supportive shoes daily for foot type and offloading insoles ?-Recommend gentle stretching and range of  motion exercises as well as trying Nervive PM no relief supplement to see if this will help with any neuropathy pains patient experience at night ?-Patient to return in 2.5-3 months for follow up evaluation or sooner if symptoms worsen. ? ?Landis Martins, DPM ? ?

## 2022-06-13 ENCOUNTER — Encounter: Payer: Self-pay | Admitting: Podiatry

## 2022-06-13 ENCOUNTER — Ambulatory Visit (INDEPENDENT_AMBULATORY_CARE_PROVIDER_SITE_OTHER): Payer: Medicare Other | Admitting: Podiatry

## 2022-06-13 DIAGNOSIS — Q828 Other specified congenital malformations of skin: Secondary | ICD-10-CM | POA: Diagnosis not present

## 2022-06-13 DIAGNOSIS — M79674 Pain in right toe(s): Secondary | ICD-10-CM | POA: Diagnosis not present

## 2022-06-13 DIAGNOSIS — B351 Tinea unguium: Secondary | ICD-10-CM | POA: Diagnosis not present

## 2022-06-13 DIAGNOSIS — M79675 Pain in left toe(s): Secondary | ICD-10-CM | POA: Diagnosis not present

## 2022-06-13 DIAGNOSIS — D689 Coagulation defect, unspecified: Secondary | ICD-10-CM

## 2022-06-13 DIAGNOSIS — E1151 Type 2 diabetes mellitus with diabetic peripheral angiopathy without gangrene: Secondary | ICD-10-CM | POA: Diagnosis not present

## 2022-06-13 DIAGNOSIS — E119 Type 2 diabetes mellitus without complications: Secondary | ICD-10-CM

## 2022-06-13 DIAGNOSIS — M2141 Flat foot [pes planus] (acquired), right foot: Secondary | ICD-10-CM

## 2022-06-18 NOTE — Progress Notes (Signed)
ANNUAL DIABETIC FOOT EXAM  Subjective: Kathryn Steele presents today for annual diabetic foot examination.  Patient confirms h/o diabetes.  Patient denies any h/o foot wounds.  Patient has been diagnosed with neuropathy.  Patient did not check blood glucose this morning.  Patient does not monitor blood glucose daily.  Risk factors: diabetes, HTN, CAD, CHF, CKD, hyperlipidemia.  Raina Mina., MD is patient's PCP. Last visit was Apr 02, 2022.  Past Medical History:  Diagnosis Date   Atypical ductal hyperplasia of left breast 12/01/2013   Patient Active Problem List   Diagnosis Date Noted   Chronic heart failure with preserved ejection fraction (HFpEF) ([place]) 03/15/2022   Age-related nuclear cataract of both eyes 01/24/2022   Seborrheic keratosis 09/05/2021   Chronic idiopathic constipation 03/06/2021   Other specified abdominal hernia without obstruction or gangrene 01/18/2021   Chalazion of right upper eyelid 06/22/2020   Subretinal hemorrhage of left eye 12/09/2018   Primary hyperparathyroidism ([place]) 04/10/2018   Restrictive lung disease 01/07/2018   Vitamin B12 deficiency 10/02/2017   Hematuria 56/38/9373   Umbilical hernia without obstruction and without gangrene 04/18/2017   Other osteoporosis without current pathological fracture 04/17/2017   Advanced nonexudative age-related macular degeneration of right eye without subfoveal involvement 03/13/2017   Combined forms of age-related cataract of right eye 03/13/2017   PVD (posterior vitreous detachment), bilateral 03/13/2017   Chronic sinusitis 08/30/2016   Malaise and fatigue 08/30/2016   Calcaneal spur of left foot 06/11/2016   Plantar fat pad atrophy of left foot 06/11/2016   Left calcaneal bursitis 05/16/2016   Porokeratosis 05/16/2016   Venous insufficiency of both lower extremities 05/16/2016   Anticoagulant long-term use 02/28/2016   CAD in native artery 02/28/2016   CKD (chronic kidney disease) stage  3, GFR 30-59 ml/min (HCC) 02/28/2016   Gastroesophageal reflux disease without esophagitis 02/28/2016   High risk medication use 02/28/2016   Localized edema 02/28/2016   Malignant neoplasm of colon ([place]) 02/28/2016   Mixed hyperlipidemia 02/28/2016   Morbid obesity with BMI of 40.0-44.9, adult ([place]) 02/28/2016   Primary osteoarthritis involving multiple joints 02/28/2016   Vitamin D deficiency 02/28/2016   History of colon cancer 02/28/2016   Class 2 severe obesity due to excess calories with serious comorbidity and body mass index (BMI) of 39.0 to 39.9 in adult ([place]) 02/28/2016   Moderate persistent asthma without complication 42/87/6811   Shortness of breath 11/23/2014   Atypical ductal hyperplasia of left breast 12/01/2013   Diabetes mellitus ([place]) 11/23/2013   Prediabetes 11/23/2013   Permanent atrial fibrillation ([place]) 11/16/2013   History reviewed. No pertinent surgical history. Current Outpatient Medications on File Prior to Visit  Medication Sig Dispense Refill   enalapril (VASOTEC) 10 MG tablet Take by mouth.     BREO ELLIPTA 100-25 MCG/INH AEPB daily.     Cyanocobalamin 5000 MCG CAPS Take 1 capsule by mouth daily.     enalapril (VASOTEC) 10 MG tablet 2 (two) times daily.      fluticasone (FLONASE) 50 MCG/ACT nasal spray Administer 2 sprays in each nostril daily as needed for Allergies.     furosemide (LASIX) 40 MG tablet Take 40 mg by mouth daily.     gentamicin ointment (GARAMYCIN) 0.1 % Apply 1 application topically 3 (three) times daily.     HYDROcodone-acetaminophen (NORCO) 7.5-325 MG tablet 1-2 tablets every 6 (six) hours as needed. for pain  0   isosorbide mononitrate (IMDUR) 30 MG 24 hr tablet Take 30 mg by mouth 2 (  two) times daily.     metoprolol (LOPRESSOR) 50 MG tablet 2 (two) times daily. Pt takes she takes 1/2 bid     Multiple Vitamins-Minerals (PRESERVISION AREDS 2 PO) Take by mouth.     mupirocin ointment (BACTROBAN) 2 % Apply topically.     OMEPRAZOLE PO  Take by mouth daily.     prednisoLONE acetate (PRED FORTE) 1 % ophthalmic suspension Place 1 drop into the right eye daily.     rosuvastatin (CRESTOR) 10 MG tablet Take 10 mg by mouth daily.     SPIRONOLACTONE PO Take by mouth.     triamcinolone cream (KENALOG) 0.1 % Apply 1 application topically 2 (two) times daily.     Vitamin D, Ergocalciferol, (DRISDOL) 1.25 MG (50000 UNIT) CAPS capsule TAKE 1 CAPSULE BY MOUTH ONCE WEEKLY (Patient taking differently: 50,000 Units every 14 (fourteen) days.) 12 capsule 4   XARELTO 20 MG TABS Take 20 mg by mouth daily.      No current facility-administered medications on file prior to visit.    Allergies  Allergen Reactions   Alprazolam Palpitations    palpitations palpitations    Pseudoephedrine Palpitations   Sulfa Antibiotics Anaphylaxis    Pain in ears Pain in ears   Nitrofurantoin Rash   Sulfamethoxazole-Trimethoprim Tinitus   Amoxicillin Other (See Comments)    Pain in legs    Ampicillin Other (See Comments)    Pain in legs    Cephalexin Other (See Comments)    Stomach get real raw   Clindamycin Swelling   Iodine Hives   Metrizamide Other (See Comments)    Skin burns   Nitrofurantoin Macrocrystal    Other     Pt states she is allergic to some drug but can't remember- she will call us with info.   Sulfasalazine Other (See Comments)    Pain in ears   Iodinated Contrast Media Itching    Skin burns Skin burns    Prednisone Palpitations   Social History   Occupational History   Not on file  Tobacco Use   Smoking status: Never   Smokeless tobacco: Never   Tobacco comments:    NEVER USED TOBACCO  Substance and Sexual Activity   Alcohol use: Not Currently   Drug use: Never   Sexual activity: Not on file   History reviewed. No pertinent family history. Immunization History  Administered Date(s) Administered   Influenza, High Dose Seasonal PF 10/02/2017   Influenza-Unspecified 08/23/2015, 09/05/2016   Pneumococcal  Polysaccharide-23 07/19/2010   Zoster, Live 11/18/2004     Review of Systems: Negative except as noted in the HPI.   Objective: There were no vitals filed for this visit.  Kathryn Steele is a pleasant 81 y.o. female in NAD. AAO X 3.  Vascular Examination: CFT <4 seconds b/l. DP/PT pulses faintly palpable b/l. Skin temperature gradient warm to warm b/l. No ischemia or gangrene. No cyanosis or clubbing noted b/l. Pedal hair absent. No pain with calf compression b/l. Patient wearing compression hose on today's visit. Varicosities present b/l.   Neurological Examination: Sensation grossly intact b/l with 10 gram monofilament. Vibratory sensation intact b/l.   Dermatological Examination: Toenails 1-5 b/l thick, discolored, elongated with subungual debris and pain on dorsal palpation.  Pedal skin is warm and supple b/l LE. Porokeratotic lesion(s) distal tip of right 2nd toe. No erythema, no edema, no drainage, no fluctuance.  Musculoskeletal Examination: Muscle strength 5/5 to b/l LE. Pes planus deformity noted bilateral LE.  Radiographs: None  Footwear Assessment: Does the patient wear appropriate shoes? Yes. Does the patient need inserts/orthotics? No.  ADA Risk Categorization: Low Risk :  Patient has all of the following: Intact protective sensation No prior foot ulcer  No severe deformity Pedal pulses present  Assessment: 1. Pain due to onychomycosis of toenails of both feet   2. Porokeratosis   3. Coagulation defect ([place])   4. Pes planus of both feet   5. 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 Encounter for diabetic foot exam [facility])      Plan: -Patient was evaluated and treated. All patient's and/or POA's questions/concerns answered on today's visit. -Examined patient. -Diabetic foot examination performed today. -Patient to continue soft, supportive shoe gear daily. -Mycotic toenails 1-5 bilaterally were debrided in length and  girth with sterile nail nippers and dremel without incident. -Porokeratotic lesion(s) distal tip of right 2nd toe pared and enucleated with sterile scalpel blade without incident. Total number of lesions debrided=1. -Patient/POA to call should there be question/concern in the interim. Return in about 3 months (around 09/13/2022).  Marzetta Board, DPM

## 2022-07-04 DIAGNOSIS — Z961 Presence of intraocular lens: Secondary | ICD-10-CM | POA: Insufficient documentation

## 2022-09-03 ENCOUNTER — Inpatient Hospital Stay: Payer: Medicare Other | Attending: Hematology & Oncology

## 2022-09-03 ENCOUNTER — Encounter: Payer: Self-pay | Admitting: Hematology & Oncology

## 2022-09-03 ENCOUNTER — Inpatient Hospital Stay (HOSPITAL_BASED_OUTPATIENT_CLINIC_OR_DEPARTMENT_OTHER): Payer: Medicare Other | Admitting: Hematology & Oncology

## 2022-09-03 DIAGNOSIS — Z79899 Other long term (current) drug therapy: Secondary | ICD-10-CM | POA: Diagnosis not present

## 2022-09-03 DIAGNOSIS — I482 Chronic atrial fibrillation, unspecified: Secondary | ICD-10-CM | POA: Insufficient documentation

## 2022-09-03 DIAGNOSIS — Z7951 Long term (current) use of inhaled steroids: Secondary | ICD-10-CM | POA: Insufficient documentation

## 2022-09-03 DIAGNOSIS — H3530 Unspecified macular degeneration: Secondary | ICD-10-CM | POA: Diagnosis not present

## 2022-09-03 DIAGNOSIS — C50312 Malignant neoplasm of lower-inner quadrant of left female breast: Secondary | ICD-10-CM

## 2022-09-03 DIAGNOSIS — Z7901 Long term (current) use of anticoagulants: Secondary | ICD-10-CM | POA: Insufficient documentation

## 2022-09-03 DIAGNOSIS — M818 Other osteoporosis without current pathological fracture: Secondary | ICD-10-CM

## 2022-09-03 DIAGNOSIS — R600 Localized edema: Secondary | ICD-10-CM | POA: Insufficient documentation

## 2022-09-03 DIAGNOSIS — R002 Palpitations: Secondary | ICD-10-CM | POA: Diagnosis not present

## 2022-09-03 DIAGNOSIS — C50919 Malignant neoplasm of unspecified site of unspecified female breast: Secondary | ICD-10-CM | POA: Insufficient documentation

## 2022-09-03 DIAGNOSIS — Z170 Estrogen receptor positive status [ER+]: Secondary | ICD-10-CM | POA: Diagnosis not present

## 2022-09-03 DIAGNOSIS — N6092 Unspecified benign mammary dysplasia of left breast: Secondary | ICD-10-CM

## 2022-09-03 DIAGNOSIS — M791 Myalgia, unspecified site: Secondary | ICD-10-CM | POA: Insufficient documentation

## 2022-09-03 HISTORY — DX: Malignant neoplasm of unspecified site of unspecified female breast: C50.919

## 2022-09-03 LAB — CBC WITH DIFFERENTIAL (CANCER CENTER ONLY)
Abs Immature Granulocytes: 0.02 10*3/uL (ref 0.00–0.07)
Basophils Absolute: 0 10*3/uL (ref 0.0–0.1)
Basophils Relative: 1 %
Eosinophils Absolute: 0.2 10*3/uL (ref 0.0–0.5)
Eosinophils Relative: 3 %
HCT: 41.3 % (ref 36.0–46.0)
Hemoglobin: 13.5 g/dL (ref 12.0–15.0)
Immature Granulocytes: 0 %
Lymphocytes Relative: 33 %
Lymphs Abs: 2 10*3/uL (ref 0.7–4.0)
MCH: 32.2 pg (ref 26.0–34.0)
MCHC: 32.7 g/dL (ref 30.0–36.0)
MCV: 98.6 fL (ref 80.0–100.0)
Monocytes Absolute: 0.8 10*3/uL (ref 0.1–1.0)
Monocytes Relative: 12 %
Neutro Abs: 3.2 10*3/uL (ref 1.7–7.7)
Neutrophils Relative %: 51 %
Platelet Count: 169 10*3/uL (ref 150–400)
RBC: 4.19 MIL/uL (ref 3.87–5.11)
RDW: 12.4 % (ref 11.5–15.5)
WBC Count: 6.2 10*3/uL (ref 4.0–10.5)
nRBC: 0 % (ref 0.0–0.2)

## 2022-09-03 LAB — CMP (CANCER CENTER ONLY)
ALT: 18 U/L (ref 0–44)
AST: 25 U/L (ref 15–41)
Albumin: 4.6 g/dL (ref 3.5–5.0)
Alkaline Phosphatase: 102 U/L (ref 38–126)
Anion gap: 9 (ref 5–15)
BUN: 30 mg/dL — ABNORMAL HIGH (ref 8–23)
CO2: 28 mmol/L (ref 22–32)
Calcium: 11.9 mg/dL — ABNORMAL HIGH (ref 8.9–10.3)
Chloride: 100 mmol/L (ref 98–111)
Creatinine: 1.05 mg/dL — ABNORMAL HIGH (ref 0.44–1.00)
GFR, Estimated: 53 mL/min — ABNORMAL LOW (ref >60)
Glucose, Bld: 98 mg/dL (ref 70–99)
Potassium: 4.7 mmol/L (ref 3.5–5.1)
Sodium: 137 mmol/L (ref 135–145)
Total Bilirubin: 0.9 mg/dL (ref 0.3–1.2)
Total Protein: 7.7 g/dL (ref 6.5–8.1)

## 2022-09-03 LAB — LACTATE DEHYDROGENASE: LDH: 167 U/L (ref 98–192)

## 2022-09-03 NOTE — Progress Notes (Signed)
Hematology and Oncology Follow Up Visit  Kathryn Steele [id number] 1940/11/19 81 y.o. 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 is back for her yearly follow-up.  However, what is surprising that she now has a new diagnosis of invasive ductal carcinoma of the left breast.  I have to believe that this is somehow related to the fact that she had this atypical ductal hyperplasia of the left breast that we have treated for quite a while with Aromasin and then stopped Aromasin 4 years ago.  She underwent a routine mammogram back in September.  This showed some calcification in the left breast.  This was about 7 cm from the nipple.  It measured 6 mm.  She underwent a ultrasound which confirmed this calcification measuring 6 x 5 x 4 mm.  She then underwent a biopsy.  The biopsy was done on 08/20/2022.  The pathology report shows that this is an invasive ductal carcinoma.  It was ER positive/PR positive/HER2 negative.  Had a very low Ki-67 score of 10%.  She is supposed to see the surgeon on Friday.   She feels okay.  Her biggest health issue is the atrial fibrillation.  She is on anticoagulation for this.  She also has some macular degeneration.  I think she gets injections into the eye.  She has had no problems with bony pain.  She has had no change in bowel or bladder habits.  There is been no increased cough or shortness of breath.  She has had no rashes.  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  She has had no bleeding.  Overall, I would say performance status is probably ECOG 2.     Medications:  Current Outpatient Medications:    BREO  ELLIPTA 100-25 MCG/INH AEPB, daily., Disp: , Rfl:    Cyanocobalamin 5000 MCG CAPS, Take 1 capsule by mouth daily., Disp: , Rfl:    enalapril (VASOTEC) 10 MG tablet, 2 (two) times daily. , Disp: , Rfl:    enalapril (VASOTEC) 10 MG tablet, Take by mouth., Disp: , Rfl:    fluticasone (FLONASE) 50 MCG/ACT nasal spray, Administer 2 sprays in each nostril daily as needed for Allergies., Disp: , Rfl:    furosemide (LASIX) 40 MG tablet, Take 40 mg by mouth daily., Disp: , Rfl:    gentamicin ointment (GARAMYCIN) 0.1 %, Apply 1 application topically 3 (three) times daily., Disp: , Rfl:    HYDROcodone-acetaminophen (NORCO) 7.5-325 MG tablet, 1-2 tablets every 6 (six) hours as needed. for pain, Disp: , Rfl: 0   isosorbide mononitrate (IMDUR) 30 MG 24 hr tablet, Take 30 mg by mouth 2 (two) times daily., Disp: , Rfl:    metoprolol (LOPRESSOR) 50 MG tablet, 2 (two) times daily. Pt takes she takes 1/2 bid, Disp: , Rfl:    Multiple Vitamins-Minerals (PRESERVISION AREDS 2 PO), Take by mouth., Disp: , Rfl:    mupirocin ointment (BACTROBAN) 2 %, Apply topically., Disp: , Rfl:    OMEPRAZOLE PO, Take by mouth daily., Disp: , Rfl:    prednisoLONE acetate (PRED FORTE) 1 % ophthalmic suspension, Place 1 drop into  the right eye daily., Disp: , Rfl:    rosuvastatin (CRESTOR) 10 MG tablet, Take 10 mg by mouth daily., Disp: , Rfl:    SPIRONOLACTONE PO, Take by mouth., Disp: , Rfl:    triamcinolone cream (KENALOG) 0.1 %, Apply 1 application topically 2 (two) times daily., Disp: , Rfl:    Vitamin D, Ergocalciferol, (DRISDOL) 1.25 MG (50000 UNIT) CAPS capsule, TAKE 1 CAPSULE BY MOUTH ONCE WEEKLY (Patient taking differently: 50,000 Units every 14 (fourteen) days.), Disp: 12 capsule, Rfl: 4   XARELTO 20 MG TABS, Take 20 mg by mouth daily. , Disp: , Rfl:   Allergies:  Allergies  Allergen Reactions   Alprazolam Palpitations    palpitations palpitations    Pseudoephedrine Palpitations   Sulfa Antibiotics Anaphylaxis    Pain  in ears Pain in ears   Nitrofurantoin Rash   Sulfamethoxazole-Trimethoprim Tinitus   Amoxicillin Other (See Comments)    Pain in legs    Ampicillin Other (See Comments)    Pain in legs    Cephalexin Other (See Comments)    Stomach get real raw   Clindamycin Swelling   Iodine Hives   Metrizamide Other (See Comments)    Skin burns   Nitrofurantoin Macrocrystal    Other     Pt states she is allergic to some drug but can't remember- she will call us with info.   Sulfasalazine Other (See Comments)    Pain in ears   Iodinated Contrast Media Itching    Skin burns Skin burns    Prednisone Palpitations    Past Medical History, Surgical history, Social history, and Family History were reviewed and updated.  Review of Systems: Review of Systems  Constitutional: Negative.   HENT: Negative.    Eyes:  Positive for blurred vision.  Respiratory: Negative.    Cardiovascular:  Positive for palpitations and leg swelling.  Gastrointestinal: Negative.   Genitourinary: Negative.   Musculoskeletal:  Positive for myalgias.  Skin: Negative.   Neurological: Negative.   Endo/Heme/Allergies: Negative.   Psychiatric/Behavioral: Negative.       Physical Exam:  weight is 199 lb (90.3 kg). Her oral temperature is 98 F (36.7 C). Her blood pressure is 143/65 (abnormal) and her pulse is 85. Her respiration is 17 and oxygen saturation is 100%.   Physical Exam Vitals reviewed.  Constitutional:      Comments: Breast exam shows right breast with no masses, edema or erythema.  There is no right axillary adenopathy.  Left breast shows no distinct mass.  She has a biopsy site at about the 9 o'clock position.  There is a dressing over this.  Maybe a little bit of firmness at the site.  There is no left nipple discharge.  There is no left axillary adenopathy.  HENT:     Head: Normocephalic and atraumatic.  Eyes:     Pupils: Pupils are equal, round, and reactive to light.  Cardiovascular:     Rate  and Rhythm: Normal rate and regular rhythm.     Heart sounds: Normal heart sounds.     Comments: Cardiovascular exam shows a regular rate and irregular rhythm consistent with atrial fibrillation.  She has no murmurs, rubs or bruits. Pulmonary:     Effort: Pulmonary effort is normal.     Breath sounds: Normal breath sounds.  Abdominal:     General: Bowel sounds are normal.     Palpations: Abdomen is soft.  Musculoskeletal:        General: No tenderness or  deformity. Normal range of motion.     Cervical back: Normal range of motion.     Comments: Lower extremities shows chronic edema.  Lymphadenopathy:     Cervical: No cervical adenopathy.  Skin:    General: Skin is warm and dry.     Findings: No erythema or rash.     Comments: On the upper back on the right side, there is a lesion that appears to be crusted.  It is about 1 x 1 cm.  It is nontender.  It is not mobile.  There is no bleeding.  Neurological:     Mental Status: She is alert and oriented to person, place, and time.  Psychiatric:        Behavior: Behavior normal.        Thought Content: Thought content normal.        Judgment: Judgment normal.      Lab Results  Component Value Date   WBC 6.2 09/03/2022   HGB 13.5 09/03/2022   HCT 41.3 09/03/2022   MCV 98.6 09/03/2022   PLT 169 09/03/2022     Chemistry      Component Value Date/Time   NA 137 09/03/2022 1129   NA 142 04/17/2017 1010   K 4.7 09/03/2022 1129   K 4.5 04/17/2017 1010   CL 100 09/03/2022 1129   CO2 28 09/03/2022 1129   CO2 28 04/17/2017 1010   BUN 30 (H) 09/03/2022 1129   BUN 13.9 04/17/2017 1010   CREATININE 1.05 (H) 09/03/2022 1129   CREATININE 1.0 04/17/2017 1010      Component Value Date/Time   CALCIUM 11.9 (H) 09/03/2022 1129   CALCIUM 10.7 (H) 04/17/2017 1010   ALKPHOS 102 09/03/2022 1129   ALKPHOS 110 04/17/2017 1010   AST 25 09/03/2022 1129   AST 17 04/17/2017 1010   ALT 18 09/03/2022 1129   ALT 11 04/17/2017 1010   BILITOT 0.9  09/03/2022 1129   BILITOT 0.59 04/17/2017 1010      Impression and Plan: Ms. Canny is 81 year old white female. She had a history of atypical ductal hyperplasia of the left breast.  There she had had this for about 8 years.  She had been on Aromasin.  We stopped the Aromasin about 4 years ago.  Again, I had to believe that this new breast cancer is somehow related to this atypical ductal hyperplasia.  I would think that all she would need would be a lumpectomy.  At her age, I cannot believe see that radiation therapy would be necessary.  I would not see any role for chemotherapy.  I probably would get her back onto Aromasin.  She will let us know when she is going to have surgery.  She is worried about having surgery.  She is worried about the effects of anesthesia.  She is worried about the atrial fibrillation.  I told her that the surgeons at [facility] are very good.  I am sure that they will be able to manage her situation.  I would think that she would be an overnight.  I am not sure that she will need to have a sentinel node biopsy as this I think would be negative and that much of this will really change management.    I told her to let us know when she is going to have surgery.  When she lets her know about her surgery, then we will plan to get her back.  I suspect she probably will come  back to see [place] in January.  Volanda Napoleon, MD 10/17/20231:49 PM

## 2022-09-18 DIAGNOSIS — C50912 Malignant neoplasm of unspecified site of left female breast: Secondary | ICD-10-CM | POA: Insufficient documentation

## 2022-09-26 ENCOUNTER — Ambulatory Visit (INDEPENDENT_AMBULATORY_CARE_PROVIDER_SITE_OTHER): Payer: Medicare Other | Admitting: Podiatry

## 2022-09-26 DIAGNOSIS — Q828 Other specified congenital malformations of skin: Secondary | ICD-10-CM

## 2022-09-26 DIAGNOSIS — B351 Tinea unguium: Secondary | ICD-10-CM

## 2022-09-26 DIAGNOSIS — M79675 Pain in left toe(s): Secondary | ICD-10-CM

## 2022-09-26 DIAGNOSIS — M79674 Pain in right toe(s): Secondary | ICD-10-CM

## 2022-09-26 DIAGNOSIS — E1151 Type 2 diabetes mellitus with diabetic peripheral angiopathy without gangrene: Secondary | ICD-10-CM

## 2022-09-26 DIAGNOSIS — D689 Coagulation defect, unspecified: Secondary | ICD-10-CM

## 2022-09-26 NOTE — Progress Notes (Signed)
Subjective:  Patient ID: Kathryn Steele, female    DOB: Mar 21, 1941,  MRN: [id number]  Kathryn Steele presents to clinic today for at risk foot care with h/o coagulation defect and painful porokeratotic lesion(s) right lower extremity and painful mycotic toenails that limit ambulation. Painful toenails interfere with ambulation. Aggravating factors include wearing enclosed shoe gear. Pain is relieved with periodic professional debridement. Painful porokeratotic lesions are aggravated when weightbearing with and without shoegear. Pain is relieved with periodic professional debridement.  Chief Complaint  Patient presents with   Nail Problem    Nail Trim  Pre Diabetic  PCP - Dr Bea Graff , pt doesn't recall last OV date    New problem(s): None.   PCP is Raina Mina., MD , and last visit was August 20, 2022.  Allergies  Allergen Reactions   Alprazolam Palpitations    palpitations palpitations    Pseudoephedrine Palpitations   Sulfa Antibiotics Anaphylaxis    Pain in ears Pain in ears   Nitrofurantoin Rash   Sulfamethoxazole-Trimethoprim Tinitus   Amoxicillin Other (See Comments)    Pain in legs    Ampicillin Other (See Comments)    Pain in legs    Cephalexin Other (See Comments)    Stomach get real raw   Clindamycin Swelling   Iodine Hives   Metrizamide Other (See Comments)    Skin burns   Nitrofurantoin Macrocrystal    Other     Pt states she is allergic to some drug but can't remember- she will call us with info.   Sulfasalazine Other (See Comments)    Pain in ears   Iodinated Contrast Media Itching    Skin burns Skin burns    Prednisone Palpitations    Review of Systems: Negative except as noted in the HPI.  Objective: No changes noted in today's physical examination.  Kathryn Steele is a pleasant 81 y.o. female obese in NAD. AAO x 3.  Vascular Examination: CFT <4 seconds b/l. DP/PT pulses faintly palpable b/l. Skin temperature gradient warm to  warm b/l. No ischemia or gangrene. No cyanosis or clubbing noted b/l. Pedal hair absent. No pain with calf compression b/l. Patient wearing compression hose on today's visit. Varicosities present b/l.   Neurological Examination: Sensation grossly intact b/l with 10 gram monofilament. Vibratory sensation intact b/l.   Dermatological Examination: Toenails 1-5 b/l thick, discolored, elongated with subungual debris and pain on dorsal palpation.  Pedal skin is warm and supple b/l LE.   Porokeratotic lesion(s) distal tip of right 2nd toe. No erythema, no edema, no drainage, no fluctuance.  Musculoskeletal Examination: Muscle strength 5/5 to b/l LE. Pes planus deformity noted bilateral LE.  Radiographs: None  Assessment/Plan: 1. Pain due to onychomycosis of toenails of both feet   2. Porokeratosis   3. Type II diabetes mellitus with peripheral circulatory disorder (HCC)     No orders of the defined types were placed in this encounter.   -Consent given for treatment as described below: -Continue diabetic foot care principles: inspect feet daily, monitor glucose as recommended by PCP and/or Endocrinologist, and follow prescribed diet per PCP, Endocrinologist and/or dietician. -Toenails 1-5 b/l were debrided in length and girth with sterile nail nippers and dremel without iatrogenic bleeding.  -Porokeratotic lesion(s) distal tip of right 2nd toe pared and enucleated with sterile currette without incident. Total number of lesions debrided=1. -Patient/POA to call should there be question/concern in the interim.   Return in about 3 months (around 12/27/2022).  Stephani Police  Elisha Ponder, DPM

## 2022-10-01 ENCOUNTER — Encounter: Payer: Self-pay | Admitting: Podiatry

## 2022-11-06 ENCOUNTER — Other Ambulatory Visit: Payer: Medicare Other

## 2022-11-06 ENCOUNTER — Ambulatory Visit: Payer: Medicare Other | Admitting: Hematology & Oncology

## 2022-12-03 ENCOUNTER — Other Ambulatory Visit: Payer: Self-pay

## 2022-12-03 DIAGNOSIS — Z170 Estrogen receptor positive status [ER+]: Secondary | ICD-10-CM

## 2022-12-04 ENCOUNTER — Inpatient Hospital Stay (HOSPITAL_BASED_OUTPATIENT_CLINIC_OR_DEPARTMENT_OTHER): Payer: Medicare Other | Admitting: Hematology & Oncology

## 2022-12-04 ENCOUNTER — Inpatient Hospital Stay: Payer: Medicare Other | Attending: Hematology & Oncology

## 2022-12-04 ENCOUNTER — Encounter: Payer: Self-pay | Admitting: Hematology & Oncology

## 2022-12-04 VITALS — BP 130/61 | HR 80 | Temp 97.7°F | Resp 20 | Ht 61.0 in | Wt 188.1 lb

## 2022-12-04 DIAGNOSIS — Z79899 Other long term (current) drug therapy: Secondary | ICD-10-CM | POA: Diagnosis not present

## 2022-12-04 DIAGNOSIS — Z7901 Long term (current) use of anticoagulants: Secondary | ICD-10-CM | POA: Diagnosis not present

## 2022-12-04 DIAGNOSIS — C50212 Malignant neoplasm of upper-inner quadrant of left female breast: Secondary | ICD-10-CM

## 2022-12-04 DIAGNOSIS — Z79811 Long term (current) use of aromatase inhibitors: Secondary | ICD-10-CM | POA: Diagnosis not present

## 2022-12-04 DIAGNOSIS — Z7951 Long term (current) use of inhaled steroids: Secondary | ICD-10-CM | POA: Diagnosis not present

## 2022-12-04 DIAGNOSIS — I482 Chronic atrial fibrillation, unspecified: Secondary | ICD-10-CM | POA: Diagnosis not present

## 2022-12-04 DIAGNOSIS — C50312 Malignant neoplasm of lower-inner quadrant of left female breast: Secondary | ICD-10-CM

## 2022-12-04 DIAGNOSIS — Z170 Estrogen receptor positive status [ER+]: Secondary | ICD-10-CM

## 2022-12-04 DIAGNOSIS — C50912 Malignant neoplasm of unspecified site of left female breast: Secondary | ICD-10-CM | POA: Diagnosis present

## 2022-12-04 DIAGNOSIS — C50211 Malignant neoplasm of upper-inner quadrant of right female breast: Secondary | ICD-10-CM | POA: Diagnosis not present

## 2022-12-04 LAB — CBC WITH DIFFERENTIAL (CANCER CENTER ONLY)
Abs Immature Granulocytes: 0.01 10*3/uL (ref 0.00–0.07)
Basophils Absolute: 0 10*3/uL (ref 0.0–0.1)
Basophils Relative: 0 %
Eosinophils Absolute: 0.1 10*3/uL (ref 0.0–0.5)
Eosinophils Relative: 2 %
HCT: 39.1 % (ref 36.0–46.0)
Hemoglobin: 12.9 g/dL (ref 12.0–15.0)
Immature Granulocytes: 0 %
Lymphocytes Relative: 36 %
Lymphs Abs: 2 10*3/uL (ref 0.7–4.0)
MCH: 32.4 pg (ref 26.0–34.0)
MCHC: 33 g/dL (ref 30.0–36.0)
MCV: 98.2 fL (ref 80.0–100.0)
Monocytes Absolute: 0.6 10*3/uL (ref 0.1–1.0)
Monocytes Relative: 11 %
Neutro Abs: 2.9 10*3/uL (ref 1.7–7.7)
Neutrophils Relative %: 51 %
Platelet Count: 167 10*3/uL (ref 150–400)
RBC: 3.98 MIL/uL (ref 3.87–5.11)
RDW: 12.5 % (ref 11.5–15.5)
WBC Count: 5.7 10*3/uL (ref 4.0–10.5)
nRBC: 0 % (ref 0.0–0.2)

## 2022-12-04 LAB — CMP (CANCER CENTER ONLY)
ALT: 15 U/L (ref 0–44)
AST: 24 U/L (ref 15–41)
Albumin: 4.5 g/dL (ref 3.5–5.0)
Alkaline Phosphatase: 84 U/L (ref 38–126)
Anion gap: 9 (ref 5–15)
BUN: 17 mg/dL (ref 8–23)
CO2: 31 mmol/L (ref 22–32)
Calcium: 11.4 mg/dL — ABNORMAL HIGH (ref 8.9–10.3)
Chloride: 101 mmol/L (ref 98–111)
Creatinine: 1.01 mg/dL — ABNORMAL HIGH (ref 0.44–1.00)
GFR, Estimated: 55 mL/min — ABNORMAL LOW (ref >60)
Glucose, Bld: 100 mg/dL — ABNORMAL HIGH (ref 70–99)
Potassium: 4 mmol/L (ref 3.5–5.1)
Sodium: 141 mmol/L (ref 135–145)
Total Bilirubin: 0.9 mg/dL (ref 0.3–1.2)
Total Protein: 7.2 g/dL (ref 6.5–8.1)

## 2022-12-04 LAB — LACTATE DEHYDROGENASE: LDH: 158 U/L (ref 98–192)

## 2022-12-04 NOTE — Progress Notes (Signed)
Hematology and Oncology Follow Up Visit  Kathryn Steele [id number] 03-Mar-1941 82 y.o. 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  When we last saw her, she was found to have a new cancer in the left breast.  A biopsy showed that this was an invasive ductal carcinoma.  She did undergo a lumpectomy at [facility].  This was done on 10/15/2022.  Thankfully, the pathology did not show any positive margins.  She had a very small cancer that was 8 mm.  The tumor there is no lymphovascular invasion.  She had a very low proliferation index of 10%.  She has recovered fairly well from this.  She is doing okay with the atrial fibrillation.  She is on Xarelto for the atrial fibrillation.  She has had no problems with change in bowel or bladder habits.  She has had no bleeding.  She has had some chronic leg swelling which is no different.  She has had no cough or shortness of breath.  Overall, I would say performance status is probably ECOG 2.  .     Medications:  Current Outpatient Medications:    BREO ELLIPTA 100-25 MCG/INH AEPB, daily as needed., Disp: , Rfl:    Cyanocobalamin 5000 MCG CAPS, Take 1 capsule by mouth daily., Disp: , Rfl:    enalapril (VASOTEC) 10 MG tablet, Take 5 mg by mouth daily., Disp: , Rfl:    furosemide (LASIX) 40 MG tablet, Take 40 mg by mouth daily., Disp: , Rfl:    HYDROcodone-acetaminophen (NORCO) 7.5-325 MG tablet, Take 1 tablet by mouth 2 (two) times daily as needed for moderate pain., Disp: , Rfl:    isosorbide mononitrate (IMDUR) 30 MG 24 hr tablet, Take 30 mg by mouth 2 (two) times daily., Disp: , Rfl:    magnesium oxide (MAG-OX) 400 MG tablet, Take by mouth  daily., Disp: , Rfl:    metoprolol (LOPRESSOR) 50 MG tablet, 2 (two) times daily. Pt takes she takes 1/2 bid, Disp: , Rfl:    Multiple Vitamin (MULTI-VITAMIN) tablet, Take 1 tablet by mouth daily., Disp: , Rfl:    Multiple Vitamins-Minerals (PRESERVISION AREDS 2 PO), Take by mouth daily., Disp: , Rfl:    SPIRONOLACTONE PO, Take by mouth., Disp: , Rfl:    Vitamin D, Ergocalciferol, (DRISDOL) 1.25 MG (50000 UNIT) CAPS capsule, TAKE 1 CAPSULE BY MOUTH ONCE WEEKLY (Patient taking differently: 50,000 Units every 14 (fourteen) days.), Disp: 12 capsule, Rfl: 4   XARELTO 20 MG TABS, Take 20 mg by mouth daily. , Disp: , Rfl:    fluticasone (FLONASE) 50 MCG/ACT nasal spray, Administer 2 sprays in each nostril daily as needed for Allergies. (Patient not taking: Reported on 12/04/2022), Disp: , Rfl:    rosuvastatin (CRESTOR) 10 MG tablet, Take 10 mg by mouth daily. (Patient not taking: Reported on 12/04/2022), Disp: , Rfl:    triamcinolone cream (KENALOG) 0.1 %, Apply 1 application topically 2 (two) times daily. (Patient not taking: Reported on 12/04/2022), Disp: , Rfl:   Allergies:  Allergies  Allergen Reactions   Alprazolam Palpitations    palpitations palpitations    Pseudoephedrine Palpitations   Sulfa Antibiotics Anaphylaxis    Pain  in ears Pain in ears   Nitrofurantoin Rash   Sulfamethoxazole-Trimethoprim Tinitus   Amoxicillin Other (See Comments)    Pain in legs    Ampicillin Other (See Comments)    Pain in legs    Cephalexin Other (See Comments)    Stomach get real raw   Clindamycin Swelling   Iodine Hives   Metrizamide Other (See Comments)    Skin burns   Nitrofurantoin Macrocrystal    Other     Pt states she is allergic to some drug but can't remember- she will call us with info.   Sulfasalazine Other (See Comments)    Pain in ears   Iodinated Contrast Media Itching    Skin burns Skin burns    Prednisone Palpitations    Past Medical History, Surgical history, Social  history, and Family History were reviewed and updated.  Review of Systems: Review of Systems  Constitutional: Negative.   HENT: Negative.    Eyes:  Positive for blurred vision.  Respiratory: Negative.    Cardiovascular:  Positive for palpitations and leg swelling.  Gastrointestinal: Negative.   Genitourinary: Negative.   Musculoskeletal:  Positive for myalgias.  Skin: Negative.   Neurological: Negative.   Endo/Heme/Allergies: Negative.   Psychiatric/Behavioral: Negative.       Physical Exam:  height is '5\' 1"'$  (1.549 m) and weight is 188 lb 1.3 oz (85.3 kg). Her temperature is 97.7 F (36.5 C). Her blood pressure is 130/61 and her pulse is 80. Her respiration is 20 and oxygen saturation is 100%.   Physical Exam Vitals reviewed.  Constitutional:      Comments: Breast exam shows right breast with no masses, edema or erythema.  There is no right axillary adenopathy.  Left breast shows the lumpectomy scar at about the 8 o'clock position.  This is well-healed.  There may be little bit of firmness at the lumpectomy site.  There is no erythema or warmth.  There is no left axillary adenopathy.    HENT:     Head: Normocephalic and atraumatic.  Eyes:     Pupils: Pupils are equal, round, and reactive to light.  Cardiovascular:     Rate and Rhythm: Normal rate and regular rhythm.     Heart sounds: Normal heart sounds.     Comments: Cardiovascular exam shows an irregular rate and irregular rhythm consistent with atrial fibrillation.  She has no murmurs, rubs or bruits. Pulmonary:     Effort: Pulmonary effort is normal.     Breath sounds: Normal breath sounds.  Abdominal:     General: Bowel sounds are normal.     Palpations: Abdomen is soft.  Musculoskeletal:        General: No tenderness or deformity. Normal range of motion.     Cervical back: Normal range of motion.     Comments: Lower extremities shows chronic edema.  Lymphadenopathy:     Cervical: No cervical adenopathy.  Skin:     General: Skin is warm and dry.     Findings: No erythema or rash.     Comments: On the upper back on the right side, there is a lesion that appears to be crusted.  It is about 1 x 1 cm.  It is nontender.  It is not mobile.  There is no bleeding.  Neurological:     Mental Status: She is alert and oriented to person, place, and time.  Psychiatric:        Behavior: Behavior normal.  Thought Content: Thought content normal.        Judgment: Judgment normal.      Lab Results  Component Value Date   WBC 5.7 12/04/2022   HGB 12.9 12/04/2022   HCT 39.1 12/04/2022   MCV 98.2 12/04/2022   PLT 167 12/04/2022     Chemistry      Component Value Date/Time   NA 141 12/04/2022 1130   NA 142 04/17/2017 1010   K 4.0 12/04/2022 1130   K 4.5 04/17/2017 1010   CL 101 12/04/2022 1130   CO2 31 12/04/2022 1130   CO2 28 04/17/2017 1010   BUN 17 12/04/2022 1130   BUN 13.9 04/17/2017 1010   CREATININE 1.01 (H) 12/04/2022 1130   CREATININE 1.0 04/17/2017 1010      Component Value Date/Time   CALCIUM 11.4 (H) 12/04/2022 1130   CALCIUM 10.7 (H) 04/17/2017 1010   ALKPHOS 84 12/04/2022 1130   ALKPHOS 110 04/17/2017 1010   AST 24 12/04/2022 1130   AST 17 04/17/2017 1010   ALT 15 12/04/2022 1130   ALT 11 04/17/2017 1010   BILITOT 0.9 12/04/2022 1130   BILITOT 0.59 04/17/2017 1010      Impression and Plan: Ms. Mcmenamin is 82 year old white female. She had a history of atypical ductal hyperplasia of the left breast.  There she had had this for about 8 years.  She had been on Aromasin.  We stopped the Aromasin about 4 years ago.  She now has a early stage breast cancer in the left breast.  This was only 8 mm.  It was a stage Ib.  At her age, I really do not think that there is going to be a defined benefit to adjuvant hormonal therapy.  I do still think that her prognosis is good to be dictated by her malignancy.  I really think that her prognosis will be dictated by her atrial fibrillation  any complications that she may have from this.  At this point, I think we can just follow her along.  I know she is being followed out at [facility].  I would like to see her back in about 3 months.  She did really well with surgery.  I am not surprised.  She was operated on by Dr. Pecolia Ades at [facility] to has such a wonderful reputation and who is incredibly compassionate with his patients.    Volanda Napoleon, MD 1/17/202412:26 PM

## 2023-01-09 ENCOUNTER — Ambulatory Visit (INDEPENDENT_AMBULATORY_CARE_PROVIDER_SITE_OTHER): Payer: Medicare Other | Admitting: Podiatry

## 2023-01-09 ENCOUNTER — Encounter: Payer: Self-pay | Admitting: Podiatry

## 2023-01-09 VITALS — BP 120/79 | HR 83

## 2023-01-09 DIAGNOSIS — Q828 Other specified congenital malformations of skin: Secondary | ICD-10-CM

## 2023-01-09 DIAGNOSIS — M79674 Pain in right toe(s): Secondary | ICD-10-CM | POA: Diagnosis not present

## 2023-01-09 DIAGNOSIS — B351 Tinea unguium: Secondary | ICD-10-CM

## 2023-01-09 DIAGNOSIS — M79675 Pain in left toe(s): Secondary | ICD-10-CM

## 2023-01-09 DIAGNOSIS — E1151 Type 2 diabetes mellitus with diabetic peripheral angiopathy without gangrene: Secondary | ICD-10-CM

## 2023-01-09 NOTE — Progress Notes (Signed)
Subjective:  Patient ID: Kathryn Steele, female    DOB: [birth year],  MRN: [id number]  Kathryn Steele presents to clinic today for at risk foot care. Pt has h/o NIDDM with PAD and corn(s) right foot, callus(es) right foot and painful mycotic nails.  Pain interferes with ambulation. Aggravating factors include wearing enclosed shoe gear. Painful toenails interfere with ambulation. Aggravating factors include wearing enclosed shoe gear. Pain is relieved with periodic professional debridement. Painful corns and calluses are aggravated when weightbearing with and without shoegear. Pain is relieved with periodic professional debridement.  Chief Complaint  Patient presents with   NAIL CARE     [place]    Callouses    CALLUS RIGHT FOOT CORN RIGHT 2ND TOE   New problem(s): None.   PCP is Raina Mina., MD.  Allergies  Allergen Reactions   Alprazolam Palpitations    palpitations palpitations    Pseudoephedrine Palpitations   Sulfa Antibiotics Anaphylaxis    Pain in ears Pain in ears   Nitrofurantoin Rash   Sulfamethoxazole-Trimethoprim Tinitus   Amoxicillin Other (See Comments)    Pain in legs    Ampicillin Other (See Comments)    Pain in legs    Cephalexin Other (See Comments)    Stomach get real raw   Clindamycin Swelling   Iodine Hives   Metrizamide Other (See Comments)    Skin burns   Nitrofurantoin Macrocrystal    Other     Pt states she is allergic to some drug but can't remember- she will call us with info.   Sulfasalazine Other (See Comments)    Pain in ears   Iodinated Contrast Media Itching    Skin burns Skin burns    Prednisone Palpitations    Review of Systems: Negative except as noted in the HPI.  Objective: No changes noted in today's physical examination. Vitals:   01/09/23 1348  BP: 120/79  Pulse: 83   Kathryn Steele is a pleasant 82 y.o. female in NAD. AAO x 3.  Vascular Examination: CFT <4 seconds b/l. DP/PT pulses faintly  palpable b/l. Skin temperature gradient warm to warm b/l. No ischemia or gangrene. No cyanosis or clubbing noted b/l. Pedal hair absent. No pain with calf compression b/l. Patient wearing compression hose on today's visit. Varicosities present b/l.   Neurological Examination: Sensation grossly intact b/l with 10 gram monofilament. Vibratory sensation intact b/l.   Dermatological Examination: Toenails 1-5 b/l thick, discolored, elongated with subungual debris and pain on dorsal palpation.  Pedal skin is warm and supple b/l LE.   Porokeratotic lesion(s) distal tip of right 2nd toe. No erythema, no edema, no drainage, no fluctuance.  Hyperkeratotic lesion(s) 5th met base right lower extremity.  No erythema, no edema, no drainage, no fluctuance.  Musculoskeletal Examination: Muscle strength 5/5 to b/l LE. Pes planus deformity noted bilateral LE.  Radiographs: None  Assessment/Plan: 1. Pain due to onychomycosis of toenails of both feet   2. Porokeratosis   3. Type II diabetes mellitus with peripheral circulatory disorder [facility])    -Patient was evaluated and treated. All patient's and/or POA's questions/concerns answered on today's visit. -Continue foot and shoe inspections daily. Monitor blood glucose per PCP/Endocrinologist's recommendations. -Patient to continue soft, supportive shoe gear daily. -Mycotic toenails 1-5 bilaterally were debrided in length and girth with sterile nail nippers and dremel without incident. -Callus(es) 5th met base right lower extremity pared utilizing sharp debridement with sterile blade without complication or incident. Total number debrided =1. -Porokeratotic lesion(s) R  2nd toe pared and enucleated with sterile currette without incident. Total number of lesions debrided=1. -Patient/POA to call should there be question/concern in the interim.   Return in about 3 months (around 04/09/2023).  Marzetta Board, DPM

## 2023-03-10 ENCOUNTER — Inpatient Hospital Stay: Payer: Medicare Other | Attending: Hematology & Oncology

## 2023-03-10 ENCOUNTER — Encounter: Payer: Self-pay | Admitting: Hematology & Oncology

## 2023-03-10 ENCOUNTER — Inpatient Hospital Stay (HOSPITAL_BASED_OUTPATIENT_CLINIC_OR_DEPARTMENT_OTHER): Payer: Medicare Other | Admitting: Hematology & Oncology

## 2023-03-10 ENCOUNTER — Other Ambulatory Visit: Payer: Self-pay

## 2023-03-10 VITALS — BP 148/72 | HR 62 | Temp 98.0°F | Resp 18 | Ht 61.0 in | Wt 187.0 lb

## 2023-03-10 DIAGNOSIS — C50211 Malignant neoplasm of upper-inner quadrant of right female breast: Secondary | ICD-10-CM

## 2023-03-10 DIAGNOSIS — I482 Chronic atrial fibrillation, unspecified: Secondary | ICD-10-CM | POA: Insufficient documentation

## 2023-03-10 DIAGNOSIS — C50912 Malignant neoplasm of unspecified site of left female breast: Secondary | ICD-10-CM | POA: Insufficient documentation

## 2023-03-10 DIAGNOSIS — C50011 Malignant neoplasm of nipple and areola, right female breast: Secondary | ICD-10-CM | POA: Diagnosis not present

## 2023-03-10 DIAGNOSIS — Z7901 Long term (current) use of anticoagulants: Secondary | ICD-10-CM | POA: Diagnosis not present

## 2023-03-10 DIAGNOSIS — Z79811 Long term (current) use of aromatase inhibitors: Secondary | ICD-10-CM | POA: Diagnosis not present

## 2023-03-10 DIAGNOSIS — C50212 Malignant neoplasm of upper-inner quadrant of left female breast: Secondary | ICD-10-CM

## 2023-03-10 DIAGNOSIS — Z79899 Other long term (current) drug therapy: Secondary | ICD-10-CM | POA: Diagnosis not present

## 2023-03-10 DIAGNOSIS — Z170 Estrogen receptor positive status [ER+]: Secondary | ICD-10-CM | POA: Diagnosis not present

## 2023-03-10 LAB — CMP (CANCER CENTER ONLY)
ALT: 11 U/L (ref 0–44)
AST: 19 U/L (ref 15–41)
Albumin: 4.2 g/dL (ref 3.5–5.0)
Alkaline Phosphatase: 94 U/L (ref 38–126)
Anion gap: 8 (ref 5–15)
BUN: 20 mg/dL (ref 8–23)
CO2: 28 mmol/L (ref 22–32)
Calcium: 11.2 mg/dL — ABNORMAL HIGH (ref 8.9–10.3)
Chloride: 103 mmol/L (ref 98–111)
Creatinine: 0.91 mg/dL (ref 0.44–1.00)
GFR, Estimated: >60 mL/min (ref >60)
Glucose, Bld: 94 mg/dL (ref 70–99)
Potassium: 4 mmol/L (ref 3.5–5.1)
Sodium: 139 mmol/L (ref 135–145)
Total Bilirubin: 1 mg/dL (ref 0.3–1.2)
Total Protein: 6.9 g/dL (ref 6.5–8.1)

## 2023-03-10 LAB — CBC WITH DIFFERENTIAL (CANCER CENTER ONLY)
Abs Immature Granulocytes: 0.02 10*3/uL (ref 0.00–0.07)
Basophils Absolute: 0 10*3/uL (ref 0.0–0.1)
Basophils Relative: 1 %
Eosinophils Absolute: 0.1 10*3/uL (ref 0.0–0.5)
Eosinophils Relative: 3 %
HCT: 38.6 % (ref 36.0–46.0)
Hemoglobin: 12.6 g/dL (ref 12.0–15.0)
Immature Granulocytes: 0 %
Lymphocytes Relative: 41 %
Lymphs Abs: 2.2 10*3/uL (ref 0.7–4.0)
MCH: 31.8 pg (ref 26.0–34.0)
MCHC: 32.6 g/dL (ref 30.0–36.0)
MCV: 97.5 fL (ref 80.0–100.0)
Monocytes Absolute: 0.7 10*3/uL (ref 0.1–1.0)
Monocytes Relative: 14 %
Neutro Abs: 2.3 10*3/uL (ref 1.7–7.7)
Neutrophils Relative %: 41 %
Platelet Count: 151 10*3/uL (ref 150–400)
RBC: 3.96 MIL/uL (ref 3.87–5.11)
RDW: 12.5 % (ref 11.5–15.5)
WBC Count: 5.4 10*3/uL (ref 4.0–10.5)
nRBC: 0 % (ref 0.0–0.2)

## 2023-03-10 NOTE — Progress Notes (Signed)
Hematology and Oncology Follow Up Visit  [name] [id number] [birth year] 82 y.o. 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 is back for her follow-up.  She is doing okay.  Her only problem is that she has been falling.  She just gets dizzy.  I do not know if this is anything related to the atrial fibrillation.  She is on Xarelto.  Thankfully, I do not think she has had any problems with bleeding.  Also, she has not had any thing that is broken.  She has had no problems with nausea or vomiting.  She has had no fever.  There is no change in bowel or bladder habits.  She has had no cough or shortness of breath.  There has been no headache.  She has had no fever.  Thankfully, she has had no problems with COVID.  She has a little bit of leg swelling but I think this is more chronic.  Overall, I would have to say that her performance status is probably ECOG 2.      Medications:  Current Outpatient Medications:    BREO ELLIPTA 100-25 MCG/INH AEPB, daily as needed., Disp: , Rfl:    Cyanocobalamin 5000 MCG CAPS, Take 1 capsule by mouth daily., Disp: , Rfl:    enalapril (VASOTEC) 10 MG tablet, Take 5 mg by mouth daily., Disp: , Rfl:    fluticasone (FLONASE) 50 MCG/ACT nasal spray, , Disp: , Rfl:    furosemide (LASIX) 40 MG tablet, Take 40 mg by mouth daily., Disp: , Rfl:    HYDROcodone-acetaminophen (NORCO) 7.5-325 MG tablet, Take 1 tablet by mouth 2 (two) times daily as needed for moderate pain., Disp: , Rfl:    isosorbide mononitrate (IMDUR) 30 MG 24 hr tablet, Take 30 mg by mouth 2 (two) times daily., Disp: , Rfl:    magnesium oxide (MAG-OX) 400 MG tablet, Take by mouth daily., Disp: , Rfl:    metoprolol  (LOPRESSOR) 50 MG tablet, 2 (two) times daily. Pt takes she takes 1/2 bid, Disp: , Rfl:    Multiple Vitamin (MULTI-VITAMIN) tablet, Take 1 tablet by mouth daily., Disp: , Rfl:    Multiple Vitamins-Minerals (PRESERVISION AREDS 2 PO), Take by mouth daily., Disp: , Rfl:    SPIRONOLACTONE PO, Take by mouth., Disp: , Rfl:    Vitamin D, Ergocalciferol, (DRISDOL) 1.25 MG (50000 UNIT) CAPS capsule, TAKE 1 CAPSULE BY MOUTH ONCE WEEKLY (Patient taking differently: 50,000 Units every 14 (fourteen) days.), Disp: 12 capsule, Rfl: 4   XARELTO 20 MG TABS, Take 20 mg by mouth daily. , Disp: , Rfl:    rosuvastatin (CRESTOR) 10 MG tablet, Take 10 mg by mouth daily. (Patient not taking: Reported on 12/04/2022), Disp: , Rfl:    triamcinolone cream (KENALOG) 0.1 %, Apply 1 application topically 2 (two) times daily. (Patient not taking: Reported on 12/04/2022), Disp: , Rfl:   Allergies:  Allergies  Allergen Reactions   Alprazolam Palpitations        Iodine Hives   Pseudoephedrine Palpitations   Sulfa Antibiotics Anaphylaxis    Pain in ears   Amoxicillin Other (See Comments)    Pain in legs    Clindamycin Swelling   Iodinated Contrast  Media Itching    Skin burns     Metrizamide Other (See Comments)    Skin burns   Nitrofurantoin Rash   Prednisone Palpitations   Sulfamethoxazole-Trimethoprim Tinitus   Ampicillin Other (See Comments)    Pain in legs    Cephalexin Other (See Comments)    Stomach get real raw   Nitrofurantoin Macrocrystal    Sulfasalazine Other (See Comments)    Pain in ears   Other Other ([place] Comments)    Pt states she is allergic to some drug but can't remember- she will call us with info.    Past Medical History, Surgical history, Social history, and Family History were reviewed and updated.  Review of Systems: Review of Systems  Constitutional: Negative.   HENT: Negative.    Eyes:  Positive for blurred vision.  Respiratory: Negative.    Cardiovascular:  Positive for  palpitations and leg swelling.  Gastrointestinal: Negative.   Genitourinary: Negative.   Musculoskeletal:  Positive for myalgias.  Skin: Negative.   Neurological: Negative.   Endo/Heme/Allergies: Negative.   Psychiatric/Behavioral: Negative.       Physical Exam:  height is  (1.549 m) and weight is 187 lb (84.8 kg). Her oral temperature is 98 F (36.7 C). Her blood pressure is 148/72 (abnormal) and her pulse is 62. Her respiration is 18 and oxygen saturation is 100%.   Physical Exam Vitals reviewed.  Constitutional:      Comments: Breast exam shows right breast with no masses, edema or erythema.  There is no right axillary adenopathy.  Left breast shows the lumpectomy scar at about the 8 o'clock position.  This is well-healed.  There may be little bit of firmness at the lumpectomy site.  There is no erythema or warmth.  There is no left axillary adenopathy.    HENT:     Head: Normocephalic and atraumatic.  Eyes:     Pupils: Pupils are equal, round, and reactive to light.  Cardiovascular:     Rate and Rhythm: Normal rate and regular rhythm.     Heart sounds: Normal heart sounds.     Comments: Cardiovascular exam shows an irregular rate and irregular rhythm consistent with atrial fibrillation.  She has no murmurs, rubs or bruits. Pulmonary:     Effort: Pulmonary effort is normal.     Breath sounds: Normal breath sounds.  Abdominal:     General: Bowel sounds are normal.     Palpations: Abdomen is soft.  Musculoskeletal:        General: No tenderness or deformity. Normal range of motion.     Cervical back: Normal range of motion.     Comments: Lower extremities shows chronic edema.  Lymphadenopathy:     Cervical: No cervical adenopathy.  Skin:    General: Skin is warm and dry.     Findings: No erythema or rash.     Comments: On the upper back on the right side, there is a lesion that appears to be crusted.  It is about 1 x 1 cm.  It is nontender.  It is not mobile.  There is  no bleeding.  Neurological:     Mental Status: She is alert and oriented to person, place, and time.  Psychiatric:        Behavior: Behavior normal.        Thought Content: Thought content normal.        Judgment: Judgment normal.     Lab Results  Component Value Date  WBC 5.4 03/10/2023   HGB 12.6 03/10/2023   HCT 38.6 03/10/2023   MCV 97.5 03/10/2023   PLT 151 03/10/2023     Chemistry      Component Value Date/Time   NA 139 03/10/2023 1159   NA 142 04/17/2017 1010   K 4.0 03/10/2023 1159   K 4.5 04/17/2017 1010   CL 103 03/10/2023 1159   CO2 28 03/10/2023 1159   CO2 28 04/17/2017 1010   BUN 20 03/10/2023 1159   BUN 13.9 04/17/2017 1010   CREATININE 0.91 03/10/2023 1159   CREATININE 1.0 04/17/2017 1010      Component Value Date/Time   CALCIUM 11.2 (H) 03/10/2023 1159   CALCIUM 10.7 (H) 04/17/2017 1010   ALKPHOS 94 03/10/2023 1159   ALKPHOS 110 04/17/2017 1010   AST 19 03/10/2023 1159   AST 17 04/17/2017 1010   ALT 11 03/10/2023 1159   ALT 11 04/17/2017 1010   BILITOT 1.0 03/10/2023 1159   BILITOT 0.59 04/17/2017 1010      Impression and Plan: Ms. Belcourt is 82 year old white female. She had a history of atypical ductal hyperplasia of the left breast.  There she had had this for about 8 years.  She had been on Aromasin.  We stopped the Aromasin about 4 years ago.  She now has a early stage breast cancer in the left breast.  This was only 8 mm.  It was a stage Ib.  Right now, we just follow her without any adjuvant hormonal therapy.  I just am not sure that this would provide a lot of benefit for her.  I still think that the atrial fibrillation is becoming more of a problem for her.  I hate that she gets dizzy.  I do not know if there is anything related to the atrial fibrillation.  We will plan to get her back now in about 4-5 months.     Josph Macho, MD 4/22/20241:20 PM

## 2023-04-09 ENCOUNTER — Ambulatory Visit (INDEPENDENT_AMBULATORY_CARE_PROVIDER_SITE_OTHER): Payer: Medicare Other | Admitting: Podiatry

## 2023-04-09 DIAGNOSIS — M79675 Pain in left toe(s): Secondary | ICD-10-CM | POA: Diagnosis not present

## 2023-04-09 DIAGNOSIS — B351 Tinea unguium: Secondary | ICD-10-CM

## 2023-04-09 DIAGNOSIS — M79674 Pain in right toe(s): Secondary | ICD-10-CM

## 2023-04-09 DIAGNOSIS — E1151 Type 2 diabetes mellitus with diabetic peripheral angiopathy without gangrene: Secondary | ICD-10-CM | POA: Diagnosis not present

## 2023-04-09 DIAGNOSIS — I70209 Unspecified atherosclerosis of native arteries of extremities, unspecified extremity: Secondary | ICD-10-CM | POA: Diagnosis not present

## 2023-04-09 DIAGNOSIS — L84 Corns and callosities: Secondary | ICD-10-CM | POA: Diagnosis not present

## 2023-04-09 NOTE — Progress Notes (Signed)
Subjective:  Patient ID: Kathryn Steele, female    DOB: 1941/05/03,  MRN: [id number]  Kathryn Steele presents to clinic today for:  Chief Complaint  Patient presents with   Nail Problem    Routine Foot Care   PCP- Feliciana Rossetti, MD Last Visit- 04/07/2023    Callouses    Callus trim right lateral aspect of foot near the arch   . Patient notes nails are thick and elongated, causing pain in shoe gear when ambulating.  She wears compression stockings for edema control.  She has sore corn at the base of the fifth metatarsal on the right foot.  PCP is Gordan Payment., MD.  Allergies  Allergen Reactions   Alprazolam Palpitations        Iodine Hives   Pseudoephedrine Palpitations   Sulfa Antibiotics Anaphylaxis    Pain in ears   Amoxicillin Other (See Comments)    Pain in legs    Clindamycin Swelling   Iodinated Contrast Media Itching    Skin burns     Metrizamide Other (See Comments)    Skin burns   Nitrofurantoin Rash   Prednisone Palpitations   Sulfamethoxazole-Trimethoprim Tinitus   Ampicillin Other (See Comments)    Pain in legs    Cephalexin Other (See Comments)    Stomach get real raw   Nitrofurantoin Macrocrystal    Sulfasalazine Other (See Comments)    Pain in ears   Other Other (See Comments)    Pt states she is allergic to some drug but can't remember- she will call us with info.    Review of Systems: Negative except as noted in the HPI.  Objective:  There were no vitals filed for this visit.  Kathryn Steele is a pleasant 82 y.o. female in NAD. AAO x 3.  Vascular Examination: Patient has palpable DP pulse, absent PT pulse bilateral.  Delayed capillary refill bilateral toes.  Sparse digital hair bilateral.  Proximal to distal cooling WNL bilateral.  +1 pitting edema bilateral  Dermatological Examination: Interspaces are clear with no open lesions noted bilateral.  Nails are 3-98mm thick, with yellowish/brown discoloration, subungual  debris and distal onycholysis x10.  There is pain with compression of nails x10.  There are hyperkeratotic lesions noted plantar aspect right fifth metatarsal base with pain on palpation.  Neurological Examination: Protective sensation intact b/l LE. Vibratory sensation intact b/l LE.  Musculoskeletal Examination: Muscle strength 5/5 to all LE muscle groups b/l.   Patient qualifies for at-risk foot care because of diabetes with PVD.  Assessment/Plan: 1. Pain due to onychomycosis of toenails of both feet   2. Type II diabetes mellitus with peripheral circulatory disorder (HCC)   3. Corns     Mycotic nails x10 were sharply debrided with sterile nail nippers and power debriding burr to decrease bulk and length.  Hyperkeratotic lesions were shaved with #312 blade.  Salinocaine and Band-Aid applied.  She can remove this at bedtime.   Return in about 3 months (around 07/10/2023) for [facility] / nail trim.   Clerance Lav, DPM, FACFAS [facility]     [address], [place] [address]  Office [phone]  Fax [phone], [place] [address]                Office [phone]  Fax [phone]

## 2023-07-09 ENCOUNTER — Ambulatory Visit: Payer: Medicare Other | Admitting: Podiatry

## 2023-07-10 ENCOUNTER — Inpatient Hospital Stay: Payer: Medicare Other | Admitting: Hematology & Oncology

## 2023-07-10 ENCOUNTER — Inpatient Hospital Stay: Payer: Medicare Other

## 2023-07-11 ENCOUNTER — Ambulatory Visit: Payer: Medicare Other | Admitting: Podiatry
